# Patient Record
Sex: Female | Born: 1980 | Race: White | Hispanic: No | Marital: Single | Smoking: Never smoker
Health system: Southern US, Community
[De-identification: ages and names within clinical notes are randomized; demographics above are authoritative.]

## PROBLEM LIST (undated history)

## (undated) DIAGNOSIS — E538 Deficiency of other specified B group vitamins: Secondary | ICD-10-CM

## (undated) DIAGNOSIS — K449 Diaphragmatic hernia without obstruction or gangrene: Secondary | ICD-10-CM

## (undated) DIAGNOSIS — K589 Irritable bowel syndrome without diarrhea: Secondary | ICD-10-CM

## (undated) DIAGNOSIS — R519 Headache, unspecified: Secondary | ICD-10-CM

## (undated) DIAGNOSIS — K219 Gastro-esophageal reflux disease without esophagitis: Secondary | ICD-10-CM

## (undated) DIAGNOSIS — D649 Anemia, unspecified: Secondary | ICD-10-CM

## (undated) DIAGNOSIS — G8929 Other chronic pain: Secondary | ICD-10-CM

## (undated) DIAGNOSIS — R197 Diarrhea, unspecified: Secondary | ICD-10-CM

## (undated) DIAGNOSIS — E039 Hypothyroidism, unspecified: Secondary | ICD-10-CM

## (undated) DIAGNOSIS — E079 Disorder of thyroid, unspecified: Secondary | ICD-10-CM

## (undated) DIAGNOSIS — K2970 Gastritis, unspecified, without bleeding: Secondary | ICD-10-CM

## (undated) DIAGNOSIS — K2990 Gastroduodenitis, unspecified, without bleeding: Secondary | ICD-10-CM

## (undated) DIAGNOSIS — M25569 Pain in unspecified knee: Secondary | ICD-10-CM

## (undated) DIAGNOSIS — R51 Headache: Secondary | ICD-10-CM

## (undated) DIAGNOSIS — F32A Depression, unspecified: Secondary | ICD-10-CM

## (undated) DIAGNOSIS — F419 Anxiety disorder, unspecified: Secondary | ICD-10-CM

## (undated) DIAGNOSIS — A498 Other bacterial infections of unspecified site: Secondary | ICD-10-CM

## (undated) DIAGNOSIS — F329 Major depressive disorder, single episode, unspecified: Secondary | ICD-10-CM

## (undated) HISTORY — DX: Gastro-esophageal reflux disease without esophagitis: K21.9

## (undated) HISTORY — DX: Irritable bowel syndrome, unspecified: K58.9

## (undated) HISTORY — DX: Other chronic pain: G89.29

## (undated) HISTORY — PX: APPENDECTOMY: SHX54

## (undated) HISTORY — DX: Disorder of thyroid, unspecified: E07.9

## (undated) HISTORY — DX: Gastroduodenitis, unspecified, without bleeding: K29.90

## (undated) HISTORY — DX: Gastritis, unspecified, without bleeding: K29.70

## (undated) HISTORY — DX: Headache, unspecified: R51.9

## (undated) HISTORY — DX: Anemia, unspecified: D64.9

## (undated) HISTORY — DX: Depression, unspecified: F32.A

## (undated) HISTORY — PX: KNEE SURGERY: SHX244

## (undated) HISTORY — DX: Major depressive disorder, single episode, unspecified: F32.9

## (undated) HISTORY — PX: ESOPHAGOGASTRODUODENOSCOPY: SHX1529

## (undated) HISTORY — DX: Diaphragmatic hernia without obstruction or gangrene: K44.9

## (undated) HISTORY — PX: COLONOSCOPY: SHX174

## (undated) HISTORY — DX: Headache: R51

## (undated) HISTORY — DX: Pain in unspecified knee: M25.569

## (undated) HISTORY — DX: Deficiency of other specified B group vitamins: E53.8

## (undated) HISTORY — DX: Diarrhea, unspecified: R19.7

## (undated) HISTORY — DX: Other bacterial infections of unspecified site: A49.8

## (undated) HISTORY — DX: Anxiety disorder, unspecified: F41.9

---
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
## 1998-11-16 ENCOUNTER — Other Ambulatory Visit: Admission: RE | Admit: 1998-11-16 | Discharge: 1998-11-16 | Payer: Self-pay | Admitting: Family Medicine

## 2002-03-14 ENCOUNTER — Ambulatory Visit (HOSPITAL_COMMUNITY): Admission: RE | Admit: 2002-03-14 | Discharge: 2002-03-14 | Payer: Self-pay | Admitting: Family Medicine

## 2002-03-14 ENCOUNTER — Encounter: Payer: Self-pay | Admitting: Family Medicine

## 2003-04-02 ENCOUNTER — Other Ambulatory Visit: Admission: RE | Admit: 2003-04-02 | Discharge: 2003-04-02 | Payer: Self-pay | Admitting: Family Medicine

## 2004-03-25 ENCOUNTER — Other Ambulatory Visit: Admission: RE | Admit: 2004-03-25 | Discharge: 2004-03-25 | Payer: Self-pay | Admitting: Obstetrics and Gynecology

## 2005-03-30 ENCOUNTER — Other Ambulatory Visit: Admission: RE | Admit: 2005-03-30 | Discharge: 2005-03-30 | Payer: Self-pay | Admitting: Obstetrics and Gynecology

## 2006-08-21 ENCOUNTER — Ambulatory Visit: Payer: Self-pay | Admitting: Internal Medicine

## 2006-08-30 ENCOUNTER — Ambulatory Visit: Payer: Self-pay | Admitting: Internal Medicine

## 2006-08-30 LAB — CONVERTED CEMR LAB
ALT: 17 units/L (ref 0–35)
AST: 25 units/L (ref 0–37)
Bilirubin, Direct: 0.1 mg/dL (ref 0.0–0.3)
CO2: 31 meq/L (ref 19–32)
Calcium: 9.7 mg/dL (ref 8.4–10.5)
Chloride: 102 meq/L (ref 96–112)
Cholesterol: 184 mg/dL (ref 0–200)
GFR calc non Af Amer: 91 mL/min
Glucose, Bld: 97 mg/dL (ref 70–99)
TSH: 3.74 microintl units/mL (ref 0.35–5.50)
Total Protein: 7.6 g/dL (ref 6.0–8.3)

## 2006-08-31 ENCOUNTER — Ambulatory Visit: Payer: Self-pay | Admitting: Internal Medicine

## 2006-09-18 ENCOUNTER — Ambulatory Visit: Payer: Self-pay | Admitting: Internal Medicine

## 2006-09-18 LAB — CONVERTED CEMR LAB
Ferritin: 17.2 ng/mL (ref 10.0–291.0)
Saturation Ratios: 17.1 % — ABNORMAL LOW (ref 20.0–50.0)

## 2006-09-21 ENCOUNTER — Encounter: Admission: RE | Admit: 2006-09-21 | Discharge: 2006-09-21 | Payer: Self-pay | Admitting: Internal Medicine

## 2006-10-19 ENCOUNTER — Ambulatory Visit: Payer: Self-pay | Admitting: Internal Medicine

## 2006-10-25 ENCOUNTER — Ambulatory Visit: Payer: Self-pay | Admitting: Internal Medicine

## 2006-10-25 ENCOUNTER — Encounter: Admission: RE | Admit: 2006-10-25 | Discharge: 2006-10-25 | Payer: Self-pay | Admitting: Internal Medicine

## 2006-10-28 ENCOUNTER — Encounter (INDEPENDENT_AMBULATORY_CARE_PROVIDER_SITE_OTHER): Payer: Self-pay | Admitting: General Surgery

## 2006-10-28 ENCOUNTER — Inpatient Hospital Stay (HOSPITAL_COMMUNITY): Admission: EM | Admit: 2006-10-28 | Discharge: 2006-10-30 | Payer: Self-pay | Admitting: Emergency Medicine

## 2006-10-31 ENCOUNTER — Ambulatory Visit: Payer: Self-pay | Admitting: Internal Medicine

## 2007-03-14 ENCOUNTER — Ambulatory Visit: Payer: Self-pay | Admitting: Internal Medicine

## 2007-03-14 DIAGNOSIS — R197 Diarrhea, unspecified: Secondary | ICD-10-CM | POA: Insufficient documentation

## 2007-03-19 ENCOUNTER — Encounter (INDEPENDENT_AMBULATORY_CARE_PROVIDER_SITE_OTHER): Payer: Self-pay | Admitting: *Deleted

## 2007-03-26 LAB — CONVERTED CEMR LAB: Pap Smear: NORMAL

## 2007-04-03 ENCOUNTER — Ambulatory Visit: Payer: Self-pay | Admitting: Gastroenterology

## 2007-04-03 LAB — CONVERTED CEMR LAB
Ferritin: 26.7 ng/mL (ref 10.0–291.0)
Saturation Ratios: 15.2 % — ABNORMAL LOW (ref 20.0–50.0)
Tissue Transglutaminase Ab, IgA: 0.6 units (ref <7)
Vitamin B-12: 136 pg/mL — ABNORMAL LOW (ref 211–911)

## 2007-05-02 ENCOUNTER — Encounter: Payer: Self-pay | Admitting: Internal Medicine

## 2007-05-02 ENCOUNTER — Encounter: Payer: Self-pay | Admitting: Gastroenterology

## 2007-05-02 ENCOUNTER — Ambulatory Visit: Payer: Self-pay | Admitting: Gastroenterology

## 2007-05-02 DIAGNOSIS — K2990 Gastroduodenitis, unspecified, without bleeding: Secondary | ICD-10-CM

## 2007-05-02 DIAGNOSIS — K2970 Gastritis, unspecified, without bleeding: Secondary | ICD-10-CM | POA: Insufficient documentation

## 2007-05-02 DIAGNOSIS — K449 Diaphragmatic hernia without obstruction or gangrene: Secondary | ICD-10-CM | POA: Insufficient documentation

## 2007-05-02 DIAGNOSIS — K589 Irritable bowel syndrome without diarrhea: Secondary | ICD-10-CM | POA: Insufficient documentation

## 2007-05-09 ENCOUNTER — Ambulatory Visit: Payer: Self-pay | Admitting: Gastroenterology

## 2007-05-16 ENCOUNTER — Ambulatory Visit: Payer: Self-pay | Admitting: Gastroenterology

## 2007-05-25 DIAGNOSIS — R519 Headache, unspecified: Secondary | ICD-10-CM | POA: Insufficient documentation

## 2007-05-25 DIAGNOSIS — R51 Headache: Secondary | ICD-10-CM | POA: Insufficient documentation

## 2007-05-29 ENCOUNTER — Ambulatory Visit: Payer: Self-pay | Admitting: Gastroenterology

## 2007-06-13 ENCOUNTER — Ambulatory Visit: Payer: Self-pay | Admitting: Gastroenterology

## 2007-06-25 ENCOUNTER — Telehealth: Payer: Self-pay | Admitting: Internal Medicine

## 2007-06-26 ENCOUNTER — Telehealth: Payer: Self-pay | Admitting: Gastroenterology

## 2007-06-26 ENCOUNTER — Encounter: Payer: Self-pay | Admitting: Gastroenterology

## 2007-07-13 ENCOUNTER — Ambulatory Visit: Payer: Self-pay | Admitting: Internal Medicine

## 2007-07-13 DIAGNOSIS — M25579 Pain in unspecified ankle and joints of unspecified foot: Secondary | ICD-10-CM | POA: Insufficient documentation

## 2007-07-13 DIAGNOSIS — R209 Unspecified disturbances of skin sensation: Secondary | ICD-10-CM | POA: Insufficient documentation

## 2007-07-16 ENCOUNTER — Telehealth: Payer: Self-pay | Admitting: Internal Medicine

## 2007-07-18 ENCOUNTER — Ambulatory Visit: Payer: Self-pay | Admitting: Gastroenterology

## 2007-07-25 ENCOUNTER — Encounter (INDEPENDENT_AMBULATORY_CARE_PROVIDER_SITE_OTHER): Payer: Self-pay | Admitting: *Deleted

## 2007-08-15 ENCOUNTER — Ambulatory Visit: Payer: Self-pay | Admitting: Gastroenterology

## 2007-08-15 DIAGNOSIS — E538 Deficiency of other specified B group vitamins: Secondary | ICD-10-CM | POA: Insufficient documentation

## 2007-09-12 ENCOUNTER — Telehealth (INDEPENDENT_AMBULATORY_CARE_PROVIDER_SITE_OTHER): Payer: Self-pay | Admitting: *Deleted

## 2007-09-12 ENCOUNTER — Ambulatory Visit: Payer: Self-pay | Admitting: Internal Medicine

## 2007-09-12 ENCOUNTER — Ambulatory Visit: Payer: Self-pay | Admitting: Gastroenterology

## 2007-09-12 DIAGNOSIS — K5289 Other specified noninfective gastroenteritis and colitis: Secondary | ICD-10-CM | POA: Insufficient documentation

## 2007-09-24 ENCOUNTER — Encounter: Payer: Self-pay | Admitting: Internal Medicine

## 2007-09-28 ENCOUNTER — Telehealth: Payer: Self-pay | Admitting: Internal Medicine

## 2007-09-28 ENCOUNTER — Encounter: Payer: Self-pay | Admitting: Internal Medicine

## 2007-09-29 ENCOUNTER — Encounter: Admission: RE | Admit: 2007-09-29 | Discharge: 2007-09-29 | Payer: Self-pay | Admitting: Internal Medicine

## 2007-10-01 ENCOUNTER — Encounter (HOSPITAL_COMMUNITY): Admission: RE | Admit: 2007-10-01 | Discharge: 2007-10-18 | Payer: Self-pay | Admitting: Neurology

## 2007-10-01 ENCOUNTER — Telehealth: Payer: Self-pay | Admitting: Internal Medicine

## 2007-10-04 ENCOUNTER — Ambulatory Visit: Payer: Self-pay | Admitting: Internal Medicine

## 2007-10-04 DIAGNOSIS — R930 Abnormal findings on diagnostic imaging of skull and head, not elsewhere classified: Secondary | ICD-10-CM | POA: Insufficient documentation

## 2007-10-04 DIAGNOSIS — R9409 Abnormal results of other function studies of central nervous system: Secondary | ICD-10-CM | POA: Insufficient documentation

## 2007-10-04 DIAGNOSIS — J320 Chronic maxillary sinusitis: Secondary | ICD-10-CM | POA: Insufficient documentation

## 2007-10-04 DIAGNOSIS — D509 Iron deficiency anemia, unspecified: Secondary | ICD-10-CM | POA: Insufficient documentation

## 2007-10-08 ENCOUNTER — Telehealth: Payer: Self-pay | Admitting: Internal Medicine

## 2007-10-10 ENCOUNTER — Ambulatory Visit: Payer: Self-pay | Admitting: Internal Medicine

## 2007-10-10 ENCOUNTER — Ambulatory Visit: Payer: Self-pay | Admitting: Gastroenterology

## 2007-10-10 LAB — CONVERTED CEMR LAB: Free T4: 0.7 ng/dL (ref 0.6–1.6)

## 2007-10-11 ENCOUNTER — Telehealth: Payer: Self-pay | Admitting: Internal Medicine

## 2007-10-12 ENCOUNTER — Ambulatory Visit: Payer: Self-pay | Admitting: Gastroenterology

## 2007-11-06 ENCOUNTER — Telehealth: Payer: Self-pay | Admitting: Internal Medicine

## 2007-11-06 DIAGNOSIS — E079 Disorder of thyroid, unspecified: Secondary | ICD-10-CM | POA: Insufficient documentation

## 2007-11-07 ENCOUNTER — Ambulatory Visit: Payer: Self-pay | Admitting: Internal Medicine

## 2007-11-22 ENCOUNTER — Encounter: Payer: Self-pay | Admitting: Gastroenterology

## 2007-12-12 ENCOUNTER — Ambulatory Visit: Payer: Self-pay | Admitting: Internal Medicine

## 2008-01-11 ENCOUNTER — Telehealth: Payer: Self-pay | Admitting: Gastroenterology

## 2008-01-16 ENCOUNTER — Ambulatory Visit: Payer: Self-pay | Admitting: Gastroenterology

## 2008-02-13 ENCOUNTER — Ambulatory Visit: Payer: Self-pay | Admitting: Gastroenterology

## 2008-03-12 ENCOUNTER — Ambulatory Visit: Payer: Self-pay | Admitting: Gastroenterology

## 2008-05-14 ENCOUNTER — Ambulatory Visit: Payer: Self-pay | Admitting: Gastroenterology

## 2008-05-28 ENCOUNTER — Telehealth: Payer: Self-pay | Admitting: Gastroenterology

## 2008-05-29 ENCOUNTER — Encounter: Payer: Self-pay | Admitting: Gastroenterology

## 2008-06-11 ENCOUNTER — Ambulatory Visit: Payer: Self-pay | Admitting: Gastroenterology

## 2008-07-09 ENCOUNTER — Ambulatory Visit: Payer: Self-pay | Admitting: Gastroenterology

## 2008-08-13 ENCOUNTER — Ambulatory Visit: Payer: Self-pay | Admitting: Gastroenterology

## 2008-08-20 ENCOUNTER — Ambulatory Visit (HOSPITAL_COMMUNITY): Admission: RE | Admit: 2008-08-20 | Discharge: 2008-08-20 | Payer: Self-pay | Admitting: Internal Medicine

## 2008-08-20 ENCOUNTER — Ambulatory Visit: Payer: Self-pay | Admitting: Internal Medicine

## 2008-08-21 ENCOUNTER — Telehealth: Payer: Self-pay | Admitting: Internal Medicine

## 2008-08-25 ENCOUNTER — Telehealth: Payer: Self-pay | Admitting: Internal Medicine

## 2008-09-12 ENCOUNTER — Ambulatory Visit: Payer: Self-pay | Admitting: Gastroenterology

## 2008-10-15 ENCOUNTER — Ambulatory Visit: Payer: Self-pay | Admitting: Gastroenterology

## 2008-11-19 ENCOUNTER — Ambulatory Visit: Payer: Self-pay | Admitting: Gastroenterology

## 2008-12-03 ENCOUNTER — Encounter: Payer: Self-pay | Admitting: Internal Medicine

## 2008-12-24 ENCOUNTER — Ambulatory Visit: Payer: Self-pay | Admitting: Gastroenterology

## 2009-01-20 ENCOUNTER — Ambulatory Visit: Payer: Self-pay | Admitting: Gastroenterology

## 2009-01-21 DIAGNOSIS — R1084 Generalized abdominal pain: Secondary | ICD-10-CM | POA: Insufficient documentation

## 2009-01-21 LAB — CONVERTED CEMR LAB
Albumin: 3.7 g/dL (ref 3.5–5.2)
Basophils Absolute: 0.1 10*3/uL (ref 0.0–0.1)
CO2: 30 meq/L (ref 19–32)
Chloride: 102 meq/L (ref 96–112)
Eosinophils Absolute: 0.2 10*3/uL (ref 0.0–0.7)
Ferritin: 18.8 ng/mL (ref 10.0–291.0)
HCT: 37.9 % (ref 36.0–46.0)
Hemoglobin: 12.9 g/dL (ref 12.0–15.0)
Iron: 50 ug/dL (ref 42–145)
Lymphocytes Relative: 27.2 % (ref 12.0–46.0)
Lymphs Abs: 2.4 10*3/uL (ref 0.7–4.0)
MCHC: 33.9 g/dL (ref 30.0–36.0)
MCV: 90.1 fL (ref 78.0–100.0)
Monocytes Absolute: 0.7 10*3/uL (ref 0.1–1.0)
Neutro Abs: 5.4 10*3/uL (ref 1.4–7.7)
Potassium: 3.4 meq/L — ABNORMAL LOW (ref 3.5–5.1)
RDW: 13.2 % (ref 11.5–14.6)
Sed Rate: 47 mm/hr — ABNORMAL HIGH (ref 0–22)
Sodium: 139 meq/L (ref 135–145)
TSH: 2.67 microintl units/mL (ref 0.35–5.50)
Total Protein: 7.3 g/dL (ref 6.0–8.3)
Vitamin B-12: 531 pg/mL (ref 211–911)

## 2009-01-23 ENCOUNTER — Ambulatory Visit (HOSPITAL_COMMUNITY): Admission: RE | Admit: 2009-01-23 | Discharge: 2009-01-23 | Payer: Self-pay | Admitting: Gastroenterology

## 2009-01-26 ENCOUNTER — Telehealth: Payer: Self-pay | Admitting: Gastroenterology

## 2009-02-18 ENCOUNTER — Telehealth (INDEPENDENT_AMBULATORY_CARE_PROVIDER_SITE_OTHER): Payer: Self-pay | Admitting: *Deleted

## 2009-02-18 ENCOUNTER — Ambulatory Visit: Payer: Self-pay | Admitting: Gastroenterology

## 2009-03-23 ENCOUNTER — Ambulatory Visit: Payer: Self-pay | Admitting: Gastroenterology

## 2009-04-22 ENCOUNTER — Telehealth: Payer: Self-pay | Admitting: Gastroenterology

## 2009-04-22 ENCOUNTER — Ambulatory Visit: Payer: Self-pay | Admitting: Gastroenterology

## 2009-04-24 ENCOUNTER — Encounter: Payer: Self-pay | Admitting: Gastroenterology

## 2009-05-22 ENCOUNTER — Ambulatory Visit: Payer: Self-pay | Admitting: Gastroenterology

## 2009-06-17 ENCOUNTER — Ambulatory Visit: Payer: Self-pay | Admitting: Gastroenterology

## 2009-07-17 ENCOUNTER — Ambulatory Visit: Payer: Self-pay | Admitting: Gastroenterology

## 2009-08-19 ENCOUNTER — Ambulatory Visit: Payer: Self-pay | Admitting: Gastroenterology

## 2009-09-04 ENCOUNTER — Telehealth: Payer: Self-pay | Admitting: Gastroenterology

## 2009-09-23 ENCOUNTER — Ambulatory Visit: Payer: Self-pay | Admitting: Gastroenterology

## 2009-10-29 ENCOUNTER — Ambulatory Visit: Payer: Self-pay | Admitting: Gastroenterology

## 2009-11-26 ENCOUNTER — Ambulatory Visit: Payer: Self-pay | Admitting: Gastroenterology

## 2009-12-24 ENCOUNTER — Ambulatory Visit: Payer: Self-pay | Admitting: Gastroenterology

## 2010-01-25 ENCOUNTER — Ambulatory Visit: Payer: Self-pay | Admitting: Gastroenterology

## 2010-02-24 ENCOUNTER — Ambulatory Visit
Admission: RE | Admit: 2010-02-24 | Discharge: 2010-02-24 | Payer: Self-pay | Source: Home / Self Care | Attending: Gastroenterology | Admitting: Gastroenterology

## 2010-03-09 NOTE — Assessment & Plan Note (Signed)
Summary: MONTHLY B12 SHOT...LSW.  Nurse Visit   Allergies: 1)  ! Prednisone 2)  ! Percocet  Medication Administration  Injection # 1:    Medication: Vit B12 1000 mcg    Diagnosis: B12 DEFICIENCY (ICD-266.2)    Route: IM    Site: L deltoid    Exp Date: 03/11/2011    Lot #: [id number]    Mfr: [employer]    Patient tolerated injection without complications    Given by: Harlow Mares CMA Duncan Dull) (August 19, 2009 3:46 PM)

## 2010-03-09 NOTE — Assessment & Plan Note (Signed)
Summary: MONTHLY B12 SHOT...LSW.  Nurse Visit   Allergies: 1)  ! Prednisone 2)  ! Percocet  Medication Administration  Injection # 1:    Medication: Vit B12 1000 mcg    Diagnosis: B12 DEFICIENCY (ICD-266.2)    Route: IM    Site: R deltoid    Exp Date: 07/09/2011    Lot #: [id number]    Mfr: [employer]    Comments: Monthly Injection    Patient tolerated injection without complications    Given by: June McMurray CMA Duncan Dull) (September 23, 2009 4:02 PM)  Orders Added: 1)  Vit B12 1000 mcg [J3420]

## 2010-03-09 NOTE — Assessment & Plan Note (Signed)
Summary: MONTHLY B12 SHOT...LSW.  Nurse Visit   Allergies: 1)  ! Prednisone 2)  ! Percocet  Medication Administration  Injection # 1:    Medication: Vit B12 1000 mcg    Diagnosis: B12 DEFICIENCY (ICD-266.2)    Route: IM    Site: R deltoid    Exp Date: 03/11/2011    Lot #: [id number]    Mfr: [employer]    Patient tolerated injection without complications    Given by: Christie Nottingham CMA Duncan Dull) (July 17, 2009 3:35 PM)  Orders Added: 1)  Vit B12 1000 mcg [J3420]

## 2010-03-09 NOTE — Progress Notes (Signed)
Summary: B12   Phone Note Call from Patient   Summary of Call: Pt was in today and recieved her monthly b12 injection and would like to go twice monthly with her injections.  She feels monthly is not helping.   Initial call taken by: Chales Abrahams CMA Duncan Dull),  February 18, 2009 4:02 PM  Follow-up for Phone Call        Talked with pt.  She feels that B12 injectons are not working because she sees no difference in her energy level.   Remains tired all the time.  Wanted to know if getting B12 inj more often would help.  Informed pt that last B12 level checked in Dec was 500 which is a normal level.  Any other suggestions? Follow-up by: Ashok Cordia RN,  February 19, 2009 8:41 AM  Additional Follow-up for Phone Call Additional follow up Details #1::         not needed...this is not the sourceof her fatigue... Additional Follow-up by: Mardella Layman MD FACG,  February 20, 2009 8:28 AM    Additional Follow-up for Phone Call Additional follow up Details #2::    Pt notified.   Follow-up by: Ashok Cordia RN,  February 20, 2009 9:02 AM

## 2010-03-09 NOTE — Assessment & Plan Note (Signed)
Summary: MONTHLY B12/JMS  Nurse Visit   Allergies: 1)  ! Prednisone 2)  ! Percocet  Medication Administration  Injection # 1:    Medication: Vit B12 1000 mcg    Diagnosis: B12 DEFICIENCY (ICD-266.2)    Route: IM    Site: R deltoid    Exp Date: 08/08/2011    Lot #: [id number]    Mfr: [employer]    Comments: Monthly injection    Patient tolerated injection without complications    Given by: June McMurray CMA Duncan Dull) (November 26, 2009 3:41 PM)  Orders Added: 1)  Vit B12 1000 mcg [J3420]   Medication Administration  Injection # 1:    Medication: Vit B12 1000 mcg    Diagnosis: B12 DEFICIENCY (ICD-266.2)    Route: IM    Site: R deltoid    Exp Date: 08/08/2011    Lot #: [id number]    Mfr: [employer]    Comments: Monthly injection    Patient tolerated injection without complications    Given by: June McMurray CMA Duncan Dull) (November 26, 2009 3:41 PM)  Orders Added: 1)  Vit B12 1000 mcg [J3420]

## 2010-03-09 NOTE — Progress Notes (Signed)
Summary: Triage   Phone Note Call from Patient Call back at [phone]   Caller: mother Bonita Quin Call For: Dr. Jarold Motto Reason for Call: Talk to Nurse Summary of Call: Wants to know if Dr. Jarold Motto would write a note stating he recommends she join [employer] due to being obese Initial call taken by: Karna Christmas,  September 04, 2009 2:11 PM  Follow-up for Phone Call        Letter mailed to pt.  Pt notified. Follow-up by: Ashok Cordia RN,  September 04, 2009 2:29 PM

## 2010-03-09 NOTE — Medication Information (Signed)
Summary: Prior [employer] & Approved for Dexilant/[employer]  Prior Duard Brady & Approved for Dexilant/[employer]   Imported By: Sherian Rein 05/01/2009 14:13:26  _____________________________________________________________________  External Attachment:    Type:   Image     Comment:   External Document

## 2010-03-09 NOTE — Assessment & Plan Note (Signed)
Summary: MONTHLY B12 SHOT...LSW.  Nurse Visit   Allergies: 1)  ! Prednisone 2)  ! Percocet  Medication Administration  Injection # 1:    Medication: Vit B12 1000 mcg    Diagnosis: B12 DEFICIENCY (ICD-266.2)    Route: IM    Site: R deltoid    Exp Date: 03/11/2011    Lot #: [id number]    Mfr: [employer]    Patient tolerated injection without complications    Given by: Christie Nottingham CMA Duncan Dull) (May 22, 2009 3:32 PM)  Orders Added: 1)  Vit B12 1000 mcg [J3420]

## 2010-03-09 NOTE — Assessment & Plan Note (Signed)
Summary: MONTHLY B12 SHOT...LSW.  Nurse Visit   Allergies: 1)  ! Prednisone 2)  ! Percocet  Medication Administration  Injection # 1:    Medication: Vit B12 1000 mcg    Diagnosis: B12 DEFICIENCY (ICD-266.2)    Route: IM    Site: R deltoid    Exp Date: 09/2011    Lot #: [id number]    Mfr: [employer]    Patient tolerated injection without complications    Given by: Merri Ray CMA Duncan Dull) (December 24, 2009 3:51 PM)  Orders Added: 1)  Vit B12 1000 mcg [J3420]

## 2010-03-09 NOTE — Assessment & Plan Note (Signed)
Summary: MONTHLY B12 SHOT...LSW.  Nurse Visit   Allergies: 1)  ! Prednisone 2)  ! Percocet  Medication Administration  Injection # 1:    Medication: Vit B12 1000 mcg    Diagnosis: B12 DEFICIENCY (ICD-266.2)    Route: IM    Site: R deltoid    Exp Date: 11/12    Lot #: [id number]    Mfr: [employer]    Comments: pt to schedule next monthly injection at front desk    Patient tolerated injection without complications    Given by: Chales Abrahams CMA Duncan Dull) (March 23, 2009 4:06 PM)  Orders Added: 1)  Vit B12 1000 mcg [J3420]

## 2010-03-09 NOTE — Progress Notes (Signed)
Summary: Delilant  Medications Added DEXILANT 60 MG CPDR (DEXLANSOPRAZOLE) one tablet by mouth once daily       Phone Note Call from Patient   Caller: Patient Summary of Call: Pt states that nexium is not helping symptoms and she feels this is caousing abd cramping.  Dexilant was much more effective for her symptoms.  Would like to go back to the dexilant.  Samples given to pt to last until we see if insurance will approve it. Initial call taken by: Ashok Cordia RN,  April 22, 2009 4:21 PM  Follow-up for Phone Call        Rx for dexilant sent to [facility] on [address].  If not covered under insurance plan, we can try to get prior auth.  Pt notified. Follow-up by: Ashok Cordia RN,  April 23, 2009 9:51 AM    New/Updated Medications: DEXILANT 60 MG CPDR (DEXLANSOPRAZOLE) one tablet by mouth once daily Prescriptions: DEXILANT 60 MG CPDR (DEXLANSOPRAZOLE) one tablet by mouth once daily  #30 x 6   Entered by:   Ashok Cordia RN   Authorized by:   Mardella Layman MD [facility]   Signed by:   Ashok Cordia RN on 04/23/2009   Method used:   Electronically to        [facility]  [phone]* (retail)       [address]       [place], [place]  [address]       Ph: [phone] or [phone]       Fax: [phone]   RxID:   [id number]   Appended Document: Delilant Dexilant approved.

## 2010-03-09 NOTE — Assessment & Plan Note (Signed)
Summary: monthly b-12 inj/all  Nurse Visit   Allergies: 1)  ! Prednisone 2)  ! Percocet  Medication Administration  Injection # 1:    Medication: Vit B12 1000 mcg    Diagnosis: B12 DEFICIENCY (ICD-266.2)    Route: IM    Site: L deltoid    Exp Date: 12/12    Lot #: [id number]    Mfr: [employer]    Patient tolerated injection without complications    Given by: Milford Cage NCMA (Jun 17, 2009 1:15 PM)  Orders Added: 1)  Vit B12 1000 mcg [J3420]

## 2010-03-09 NOTE — Assessment & Plan Note (Signed)
Summary: Monthly B12/dfs  Nurse Visit   Allergies: 1)  ! Prednisone 2)  ! Percocet  Medication Administration  Injection # 1:    Medication: Vit B12 1000 mcg    Diagnosis: ANEMIA, IRON DEFICIENCY (ICD-280.9)    Route: IM    Site: L deltoid    Exp Date: 12/2010    Lot #: [id number]    Mfr: [employer]    Comments: pt to schedule next monthy at front desk    Patient tolerated injection without complications    Given by: Chales Abrahams CMA Duncan Dull) (February 18, 2009 4:01 PM)  Orders Added: 1)  Vit B12 1000 mcg [J3420]

## 2010-03-09 NOTE — Assessment & Plan Note (Signed)
Summary: B12 SHOT  Nurse Visit   Allergies: 1)  ! Prednisone 2)  ! Percocet  Medication Administration  Injection # 1:    Medication: Vit B12 1000 mcg    Diagnosis: B12 DEFICIENCY (ICD-266.2)    Route: IM    Site: L deltoid    Exp Date: 12/2010    Lot #: [id number]    Mfr: [employer]    Comments: pt to schedule next monthly b12 at front desk    Patient tolerated injection without complications    Given by: Chales Abrahams CMA Duncan Dull) (April 22, 2009 4:06 PM)  Orders Added: 1)  Vit B12 1000 mcg [J3420]

## 2010-03-11 NOTE — Assessment & Plan Note (Signed)
Summary: MONTHLY B12 SHOT  Nurse Visit   Allergies: 1)  ! Prednisone 2)  ! Percocet  Medication Administration  Injection # 1:    Medication: Vit B12 1000 mcg    Diagnosis: B12 DEFICIENCY (ICD-266.2)    Route: IM    Site: L deltoid    Exp Date: 11/08/2011    Lot #: [id number]    Mfr: [employer]    Comments: Monthly vitamin B12 injection    Patient tolerated injection without complications    Given by: June McMurray CMA Duncan Dull) (January 25, 2010 4:06 PM)  Orders Added: 1)  Vit B12 1000 mcg [J3420]   Medication Administration  Injection # 1:    Medication: Vit B12 1000 mcg    Diagnosis: B12 DEFICIENCY (ICD-266.2)    Route: IM    Site: L deltoid    Exp Date: 11/08/2011    Lot #: [id number]    Mfr: [employer]    Comments: Monthly vitamin B12 injection    Patient tolerated injection without complications    Given by: June McMurray CMA Duncan Dull) (January 25, 2010 4:06 PM)  Orders Added: 1)  Vit B12 1000 mcg [J3420]

## 2010-03-11 NOTE — Assessment & Plan Note (Signed)
Summary: MONTHLY B12 SHOT/JMS  Nurse Visit   Allergies: 1)  ! Prednisone 2)  ! Percocet  Medication Administration  Injection # 1:    Medication: Vit B12 1000 mcg    Diagnosis: B12 DEFICIENCY (ICD-266.2)    Route: IM    Site: R deltoid    Exp Date: 12/09/2011    Lot #: [id number]    Mfr: [employer]    Comments: monthlly injection    Patient tolerated injection without complications    Given by: June McMurray CMA Duncan Dull) (February 24, 2010 4:12 PM)  Orders Added: 1)  Vit B12 1000 mcg [J3420]   Medication Administration  Injection # 1:    Medication: Vit B12 1000 mcg    Diagnosis: B12 DEFICIENCY (ICD-266.2)    Route: IM    Site: R deltoid    Exp Date: 12/09/2011    Lot #: [id number]    Mfr: [employer]    Comments: monthlly injection    Patient tolerated injection without complications    Given by: June McMurray CMA Duncan Dull) (February 24, 2010 4:12 PM)  Orders Added: 1)  Vit B12 1000 mcg [J3420]

## 2010-03-31 ENCOUNTER — Encounter: Payer: Self-pay | Admitting: Gastroenterology

## 2010-03-31 ENCOUNTER — Encounter (INDEPENDENT_AMBULATORY_CARE_PROVIDER_SITE_OTHER): Payer: BC Managed Care – PPO

## 2010-03-31 DIAGNOSIS — E538 Deficiency of other specified B group vitamins: Secondary | ICD-10-CM

## 2010-04-06 NOTE — Assessment & Plan Note (Signed)
Summary: MONTLY B12 SHOT  Nurse Visit   Allergies: 1)  ! Prednisone 2)  ! Percocet  Medication Administration  Injection # 1:    Medication: Vit B12 1000 mcg    Diagnosis: B12 DEFICIENCY (ICD-266.2)    Route: IM    Site: R deltoid    Exp Date: 11/13    Lot #: [id number]    Mfr: [employer]    Comments: pt to schedule next monthly b12 at front desk    Patient tolerated injection without complications    Given by: Chales Abrahams CMA Duncan Dull) (March 31, 2010 4:13 PM)  Orders Added: 1)  Vit B12 1000 mcg [J3420]

## 2010-04-29 ENCOUNTER — Ambulatory Visit (INDEPENDENT_AMBULATORY_CARE_PROVIDER_SITE_OTHER): Payer: BC Managed Care – PPO | Admitting: Gastroenterology

## 2010-04-29 DIAGNOSIS — E538 Deficiency of other specified B group vitamins: Secondary | ICD-10-CM

## 2010-04-29 MED ORDER — CYANOCOBALAMIN 1000 MCG/ML IJ SOLN
1000.0000 ug | INTRAMUSCULAR | Status: AC
  Administered 2010-04-29: 1000 ug via INTRAMUSCULAR

## 2010-05-03 ENCOUNTER — Other Ambulatory Visit: Payer: Self-pay | Admitting: Gastroenterology

## 2010-05-06 ENCOUNTER — Telehealth: Payer: Self-pay | Admitting: Gastroenterology

## 2010-05-06 MED ORDER — DEXLANSOPRAZOLE 60 MG PO CPDR: 60.0000 mg | DELAYED_RELEASE_CAPSULE | Freq: Every day | ORAL | Status: DC

## 2010-05-06 NOTE — Telephone Encounter (Addendum)
i advised pt I will do her prior auth today and call her when its done and I advised her she needs office visit she has not been seen since 2010. appt made for 05/27/2010.

## 2010-05-06 NOTE — Telephone Encounter (Signed)
Per [employer] has been approved. Faxed approval to the pharm.

## 2010-05-21 ENCOUNTER — Ambulatory Visit (HOSPITAL_BASED_OUTPATIENT_CLINIC_OR_DEPARTMENT_OTHER)
Admission: RE | Admit: 2010-05-21 | Discharge: 2010-05-21 | Disposition: A | Discharge status: Home / Self Care | Payer: BC Managed Care – PPO | Source: Ambulatory Visit | Attending: Specialist | Admitting: Specialist

## 2010-05-21 DIAGNOSIS — M2240 Chondromalacia patellae, unspecified knee: Secondary | ICD-10-CM | POA: Insufficient documentation

## 2010-05-21 DIAGNOSIS — M659 Unspecified synovitis and tenosynovitis, unspecified site: Secondary | ICD-10-CM | POA: Insufficient documentation

## 2010-05-21 DIAGNOSIS — M2350 Chronic instability of knee, unspecified knee: Secondary | ICD-10-CM | POA: Insufficient documentation

## 2010-05-21 DIAGNOSIS — Z01812 Encounter for preprocedural laboratory examination: Secondary | ICD-10-CM | POA: Insufficient documentation

## 2010-05-21 LAB — POCT HEMOGLOBIN-HEMACUE: Hemoglobin: 12.7 g/dL (ref 12.0–15.0)
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
## 2010-05-27 ENCOUNTER — Ambulatory Visit: Payer: BC Managed Care – PPO | Admitting: Gastroenterology

## 2010-05-28 ENCOUNTER — Other Ambulatory Visit (INDEPENDENT_AMBULATORY_CARE_PROVIDER_SITE_OTHER): Payer: BC Managed Care – PPO

## 2010-05-28 ENCOUNTER — Ambulatory Visit (INDEPENDENT_AMBULATORY_CARE_PROVIDER_SITE_OTHER): Payer: BC Managed Care – PPO | Admitting: Gastroenterology

## 2010-05-28 ENCOUNTER — Encounter: Payer: Self-pay | Admitting: Gastroenterology

## 2010-05-28 ENCOUNTER — Telehealth: Payer: Self-pay | Admitting: Gastroenterology

## 2010-05-28 DIAGNOSIS — K219 Gastro-esophageal reflux disease without esophagitis: Secondary | ICD-10-CM

## 2010-05-28 DIAGNOSIS — E538 Deficiency of other specified B group vitamins: Secondary | ICD-10-CM

## 2010-05-28 DIAGNOSIS — K589 Irritable bowel syndrome without diarrhea: Secondary | ICD-10-CM

## 2010-05-28 LAB — CBC WITH DIFFERENTIAL/PLATELET
Basophils Absolute: 0 10*3/uL (ref 0.0–0.1)
Eosinophils Absolute: 0.2 10*3/uL (ref 0.0–0.7)
Eosinophils Relative: 1.7 % (ref 0.0–5.0)
HCT: 40.6 % (ref 36.0–46.0)
Lymphs Abs: 1.5 10*3/uL (ref 0.7–4.0)
MCV: 89.8 fl (ref 78.0–100.0)
Monocytes Absolute: 0.6 10*3/uL (ref 0.1–1.0)
Neutrophils Relative %: 78.4 % — ABNORMAL HIGH (ref 43.0–77.0)
Platelets: 214 10*3/uL (ref 150.0–400.0)
RDW: 14 % (ref 11.5–14.6)
WBC: 10.6 10*3/uL — ABNORMAL HIGH (ref 4.5–10.5)

## 2010-05-28 LAB — HEPATIC FUNCTION PANEL
ALT: 18 U/L (ref 0–35)
AST: 25 U/L (ref 0–37)
Alkaline Phosphatase: 68 U/L (ref 39–117)
Total Bilirubin: 0.6 mg/dL (ref 0.3–1.2)

## 2010-05-28 LAB — BASIC METABOLIC PANEL
BUN: 13 mg/dL (ref 6–23)
CO2: 31 mEq/L (ref 19–32)
Chloride: 99 mEq/L (ref 96–112)
Creatinine, Ser: 1 mg/dL (ref 0.4–1.2)
Potassium: 4.4 mEq/L (ref 3.5–5.1)

## 2010-05-28 LAB — VITAMIN B12: Vitamin B-12: 649 pg/mL (ref 211–911)

## 2010-05-28 LAB — FERRITIN: Ferritin: 21 ng/mL (ref 10.0–291.0)

## 2010-05-28 LAB — IBC PANEL: Iron: 79 ug/dL (ref 42–145)

## 2010-05-28 MED ORDER — CILIDINIUM-CHLORDIAZEPOXIDE 2.5-5 MG PO CAPS: 1.0000 | ORAL_CAPSULE | Freq: Three times a day (TID) | ORAL | Status: DC | PRN

## 2010-05-28 MED ORDER — SUCRALFATE 1 GM/10ML PO SUSP: 1.0000 g | Freq: Four times a day (QID) | ORAL | Status: DC | PRN

## 2010-05-28 NOTE — Telephone Encounter (Signed)
i can not find that medicaiton on the list when patient comes in we will have her bring her bottles.

## 2010-05-28 NOTE — Patient Instructions (Signed)
Please go to the basement today for your labs. , Your prescription(s) have been sent to you pharmacy.  Call back to schedule your Endoscopy, the number is [phone]. You will need a previsit.

## 2010-06-01 ENCOUNTER — Encounter: Payer: Self-pay | Admitting: Gastroenterology

## 2010-06-01 DIAGNOSIS — E538 Deficiency of other specified B group vitamins: Secondary | ICD-10-CM | POA: Insufficient documentation

## 2010-06-01 DIAGNOSIS — K219 Gastro-esophageal reflux disease without esophagitis: Secondary | ICD-10-CM | POA: Insufficient documentation

## 2010-06-01 NOTE — Progress Notes (Signed)
History of Present Illness: This is a 45 Caucasian female with chronic acid reflux refractory to PPI therapy. He has upcoming surgery on her foot, but denies abuse of NSAIDs, cigarettes or alcohol. She also has continued vague epigastric and right upper quadrant abdominal pain the previous negative GI workup. She denies dysphagia, hepatobiliary complaints, or gastrointestinal symptoms. He does have a history of B12 deficiency is on replacement therapy. She also has a history of chronic IBS.  Current Medications, Allergies, Past Medical History, Past Surgical History, Family History and Social History were reviewed in [employer] record.   Assessment and plan: Refractory GERD, rule out enlarging hiatal hernia, H. pylori infection, versus functional problems. Labs have been ordered and an endoscopic exam has been recommended. Encounter Diagnoses  Name Primary?  . Esophageal reflux   . Irritable bowel syndrome   . Vitamin B12 deficiency

## 2010-06-07 ENCOUNTER — Telehealth: Payer: Self-pay | Admitting: Gastroenterology

## 2010-06-07 NOTE — Telephone Encounter (Signed)
Pt is calling and wants to know her lab results. Let pt know that Dr. Jarold Motto is out of the office but that we would call her back when he returns with the results.

## 2010-06-09 ENCOUNTER — Telehealth: Payer: Self-pay | Admitting: Gastroenterology

## 2010-06-09 NOTE — Telephone Encounter (Signed)
Labs normal.

## 2010-06-09 NOTE — Telephone Encounter (Signed)
Taylor Yates is calling patient about her labs, Per DRP all normal.

## 2010-06-09 NOTE — Telephone Encounter (Signed)
I have left a voicemail for the patient that her labs were normal and no longer needs B12 injections.  She was asked to call back for any questions.

## 2010-06-14 ENCOUNTER — Telehealth: Payer: Self-pay | Admitting: Gastroenterology

## 2010-06-14 DIAGNOSIS — K219 Gastro-esophageal reflux disease without esophagitis: Secondary | ICD-10-CM

## 2010-06-14 DIAGNOSIS — E538 Deficiency of other specified B group vitamins: Secondary | ICD-10-CM

## 2010-06-14 DIAGNOSIS — K589 Irritable bowel syndrome without diarrhea: Secondary | ICD-10-CM

## 2010-06-14 MED ORDER — CILIDINIUM-CHLORDIAZEPOXIDE 2.5-5 MG PO CAPS: 1.0000 | ORAL_CAPSULE | Freq: Three times a day (TID) | ORAL | Status: DC | PRN

## 2010-06-14 NOTE — Telephone Encounter (Signed)
Pt's mom called back to report they never got the script filled for Librax? Reordered to [facility] [address] per mom.

## 2010-06-14 NOTE — Telephone Encounter (Signed)
Addended by: Graciella Freer on: 06/14/2010 09:33 AM   Modules accepted: Orders

## 2010-06-14 NOTE — Telephone Encounter (Signed)
Pt stated Saturday afternoon she developed severs stomach pain that radiated to her back. Pt had some Hydrocodone from recent knee surgery that relieved the pain. This am around 4am, the pain started again.Pt was given Librax on 05/28/10 OV, but stated she hasn't been able to work taking the drug in her schedule. Pt denies diarrhea and stated she isn't having "good BM's". I asked if she was taking the Hydrocodone "regularly" and she stated no. Informed pt narcotics can slow the bowel down and I suggested she take stool softeners once or twice daily. I also instructed her to fill the Librax and begin taking it as ordered and see if that helps her pain; call us [place] pain doesn't get better. Pt stated understanding.

## 2010-06-15 ENCOUNTER — Inpatient Hospital Stay (INDEPENDENT_AMBULATORY_CARE_PROVIDER_SITE_OTHER)
Admission: RE | Admit: 2010-06-15 | Discharge: 2010-06-15 | Disposition: A | Discharge status: Home / Self Care | Payer: BC Managed Care – PPO | Source: Ambulatory Visit | Attending: Family Medicine | Admitting: Family Medicine

## 2010-06-15 ENCOUNTER — Telehealth: Payer: Self-pay | Admitting: *Deleted

## 2010-06-15 DIAGNOSIS — K589 Irritable bowel syndrome without diarrhea: Secondary | ICD-10-CM

## 2010-06-15 NOTE — Telephone Encounter (Signed)
Pt's mother called again to report the Librax has not helped the abdominal pain that radiates to pt's back that was reported yesterday. I then spoke with the pt who reports the pain began Saturday early am and she had to take Hydrocodone from her recent knee surgery to relieve it. The pain recurred again yesterday at 4:26am. She reports early satiety and the stool softeners haven't helped her have a "good BM".  Pt's mom stated the pain is worse when the pt lies down and is better when she is standing. Mom thinks pt may have a blockage. Informed pt and mom we have no appts today with the mid level or Dr Jarold Motto and she should go to the ER or an UC. Mom called back to see if one of our other doctors will see pt. Dr Christella Hartigan is on call and has no openings. Mom wanted a copy of pt's records to take with pt and I suggested she go to [facility] and they can pull up our records. Mom verbalized understanding.

## 2010-06-16 ENCOUNTER — Telehealth: Payer: Self-pay | Admitting: *Deleted

## 2010-06-16 ENCOUNTER — Other Ambulatory Visit: Payer: Self-pay | Admitting: Gastroenterology

## 2010-06-16 ENCOUNTER — Emergency Department (HOSPITAL_COMMUNITY): Payer: BC Managed Care – PPO

## 2010-06-16 ENCOUNTER — Emergency Department (HOSPITAL_COMMUNITY)
Admission: EM | Admit: 2010-06-16 | Discharge: 2010-06-16 | Disposition: A | Discharge status: Home / Self Care | Payer: BC Managed Care – PPO | Attending: Emergency Medicine | Admitting: Emergency Medicine

## 2010-06-16 DIAGNOSIS — K8020 Calculus of gallbladder without cholecystitis without obstruction: Secondary | ICD-10-CM | POA: Insufficient documentation

## 2010-06-16 DIAGNOSIS — R109 Unspecified abdominal pain: Secondary | ICD-10-CM | POA: Insufficient documentation

## 2010-06-16 LAB — CBC
MCHC: 33.6 g/dL (ref 30.0–36.0)
MCV: 88.7 fL (ref 78.0–100.0)
Platelets: 238 10*3/uL (ref 150–400)
RDW: 13.4 % (ref 11.5–15.5)
WBC: 9.8 10*3/uL (ref 4.0–10.5)

## 2010-06-16 LAB — COMPREHENSIVE METABOLIC PANEL
Albumin: 3.8 g/dL (ref 3.5–5.2)
Alkaline Phosphatase: 72 U/L (ref 39–117)
BUN: 14 mg/dL (ref 6–23)
Calcium: 10 mg/dL (ref 8.4–10.5)
Potassium: 4.4 mEq/L (ref 3.5–5.1)
Total Protein: 8.1 g/dL (ref 6.0–8.3)

## 2010-06-16 LAB — URINE MICROSCOPIC-ADD ON

## 2010-06-16 LAB — URINALYSIS, ROUTINE W REFLEX MICROSCOPIC
Nitrite: NEGATIVE
Specific Gravity, Urine: 1.009 (ref 1.005–1.030)
Urobilinogen, UA: 0.2 mg/dL (ref 0.0–1.0)
pH: 6.5 (ref 5.0–8.0)

## 2010-06-16 LAB — POCT PREGNANCY, URINE: Preg Test, Ur: NEGATIVE

## 2010-06-16 LAB — DIFFERENTIAL
Lymphs Abs: 1.7 10*3/uL (ref 0.7–4.0)
Monocytes Relative: 7 % (ref 3–12)
Neutro Abs: 7.1 10*3/uL (ref 1.7–7.7)
Neutrophils Relative %: 73 % (ref 43–77)

## 2010-06-16 NOTE — Telephone Encounter (Signed)
Pt's mom called to report pt went to the ER and pt was dx with Gall Bladder disease. Mom wants to know if Dr Jarold Motto can recommend surgeon who will do the Cholecystectomy and Hernia Repair at the same time. [phone] mom     [phone] pt  Informed Mom Dr Jarold Motto recommends pt have Cholecystectomy done and he will discuss the Hernia Repair later. Pt has an appt with Dr Johna Sheriff on Monday. Mom verbalized understanding.

## 2010-06-22 NOTE — Assessment & Plan Note (Signed)
[facility]                                 ON-CALL NOTE   NAZIRAH, TRI                    MRN:          [id number]  DATE:05/02/2007                            DOB:          Jun 10, 1980    Ms. Enlow called tonight stating that she has a gas pocket that is  moving upwards instead of downwards.  She is without abdominal pain per  se.  She asked whether she could take some Gas-X.  I told her that there  is no problem with that, and that she should call back if she is having  abdominal pain or fever.     Barbette Hair. Arlyce Dice, MD,FACG  Electronically Signed    RDK/MedQ  DD: 05/02/2007  DT: 05/03/2007  Job #: [id number]   cc:   Vania Rea. Jarold Motto, MD, Caleen Essex, FAGA

## 2010-06-22 NOTE — Op Note (Signed)
NAMERHEGAN, Taylor Yates NO.:  [id number]   MEDICAL RECORD NO.:  [id number]          PATIENT TYPE:  INP   LOCATION:  0098                         FACILITY:  [facility]   PHYSICIAN:  Adolph Pollack, M.D.DATE OF BIRTH:  08-03-1980   DATE OF PROCEDURE:  10/28/2006  DATE OF DISCHARGE:                               OPERATIVE REPORT   PREOPERATIVE DIAGNOSIS:  Acute appendicitis.   POSTOPERATIVE DIAGNOSIS:  Acute appendicitis.   PROCEDURE:  Laparoscopic appendectomy.   SURGEON:  Adolph Pollack, M.D.   ANESTHESIA:  General.   INDICATIONS:  This 30 year old female presented to the emergency room  with some right lower quadrant pain, leukocytosis, and CT scan was  ordered and found to be consistent with appendicitis.  She is now  brought to the operating room for appendectomy.   TECHNIQUE:  She is brought to the operating room and placed supine on  the operating table.  General anesthetic was administered.  A Foley  catheter was placed in the bladder.  The abdominal wall was sterilely  prepped and draped.  Marcaine solution was infiltrated in the  subumbilical region.  A subumbilical incision was made through the skin,  subcutaneous tissue, fascia and peritoneum entering the peritoneal  cavity.  A pursestring suture of 0 Vicryl was placed around the fascial  edges.  A Hassan trocar was introduced into the peritoneal cavity and  pneumoperitoneum created by insufflation of CO2 gas.   Next, the laparoscope was introduced.  I could see the tip of the  appendix appeared to be injected and acutely inflamed but not  perforated.  A 5 mm trocar was placed in the left lower quadrant just  lateral to midline.  A 5 mm trocar was placed in the right upper  quadrant.  The mesoappendix was grasped and retracted anteriorly.  I  then divided the mesoappendix toward the base of the cecum with the  harmonic scalpel.  Using the Endo-GIA stapler, the appendix was  amputated off the  cecum and placed in an Endopouch bag.  It was removed  through the subumbilical port.   Following this, I replaced the subumbilical trocar.  I copiously  irrigated out the right lower quadrant region and noticed a little bit  of bleeding from the staple line which was controlled with hemoclips.  I  then evacuated the fluid.  I re-examined the staple line again and there  was no bleeding, there was no leak.  Following this, I removed the left  lower quadrant trocar, no bleeding was noted.  The [facility] trocar was  removed and the fascial defect in the subumbilical area was closed under  laparoscopic vision by tightening up and tying down the pursestring  suture.  The pneumoperitoneum was released and the remaining trocar  removed.   The skin incisions were closed with 4-0 Monocryl subcuticular stitches  followed by Steri-Strips and sterile dressings.  She tolerated the  procedure well without apparent complications.  She was taken to the  recovery room in satisfactory condition.      Adolph Pollack, M.D.  Electronically Signed     TJR/MEDQ  D:  10/28/2006  T:  10/28/2006  Job:  [id number]

## 2010-06-22 NOTE — Assessment & Plan Note (Signed)
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 Barbette Hair. Artist Pais, for evaluation of  chronic reflux, dysphagia, globus sensation in her throat and chronic  irritable bowel syndrome with alternating diarrhea and constipation.   For the last several years, Taylor Yates has had rather typical reflux with  burning substernal chest pain and regurgitation both day and night time.  She has had a globus sensation in her throat, associated with  intermittent solid food dysphagia.  She has not had x-ray or endoscopic  examinations of her esophagus.  She has really not been on any  medications except for antacids.  She denied any specific hepatobiliary  complaints or clay-colored stools, dark urine, icterus, fever or chills.  Her appetite is good and her weight is stable.  She denies any specific  food intolerances.  She has rather classic IBS with alternating diarrhea  and constipation, with associated gas and bloating.  She at times does  see some mucus and blood in her stool.  Again, she has not had any  endoscopic or barium studies of her bowels.  She is status post  emergency appendectomy by Dr. Adolph Pollack in September 2008.   She has recently been seen by Dr. Artist Pais and exam was unremarkable.   LABORATORY DATA:  A review of her laboratory data from September showed  a normal CBC, metabolic profile and abdominal CT scan.  A pregnancy test  at that time was normal.  Thyroid function tests were normal.  Serum  iron levels were normal.  Pathology report of her appendix showed acute  appendicitis, without evidence of inflammatory bowel disease.   The patient recently has been undergoing a thyroid  workup by Dr.  Artist Pais,  and she also has a history of attention deficit disorder and a history  of genital warts.  She also had more recent blood work done on February 12, 2007.  This again showed a normal CBC, metabolic profile and TSH  level.   PAST MEDICAL HISTORY:  Otherwise is noncontributory except for chronic  headaches.  She is having regular menstrual periods.   FAMILY HISTORY:  Unknown because the patient is adopted.   SOCIAL HISTORY:  She is single and lives by herself.  She has a B.S.  degree and works as a Set designer.  She does not smoke  or use Ethanol.   REVIEW OF SYSTEMS:  Otherwise noncontributory except for some vague  arthralgias and chronic fatigue.  She does have some deconditioning with  shortness of breath on exertion, also has painful menses.  She denies  any current cardiovascular, pulmonary or neuropsychiatric problems.  The  review of systems otherwise noncontributory.   PHYSICAL EXAMINATION:  GENERAL:  She is a healthy-appearing white  female, in no distress, appearing her stated age.  VITAL SIGNS:  She  is 5 feet 4 inches and weighs 222 pounds.  Blood  pressure 110/80, pulse 72 and regular.  NECK:  I could not appreciate thyromegaly or lymphadenopathy.  CHEST:  Entirely clear.  HEART:  A regular rhythm without murmurs, gallops or rubs.  ABDOMEN:  I could not appreciate hepatosplenomegaly, abdominal masses or  tenderness.  EXTREMITIES:  Peripheral extremities were unremarkable.  NEUROLOGIC:  Mental status was clear.  RECTAL:  Inspection of the rectum was unremarkable, as was the rectal  exam and stool is guaiac-negative.   ASSESSMENT:  1. Chronic gastroesophageal reflux disease with associated dysplasia -      rule out Barrett's mucosa and esophageal stricture.  2. Irritable bowel syndrome with intermittent rectal bleeding - rule      out colon polyps, although I suspect her bleeding is coming from      local anal irritation with a  history of genital warts.  3. History of appendectomy in September 2008.   MEDICAL DECISION MAKING:  1. A high-fiber diet with daily Benefiber and liberal p.o. fluids.  2. Reflux regimen along with Capodex 60 mg daily. She also will be      placed on an antireflux regimen.  3. Outpatient endoscopy and colonoscopy.  4. Will check an anemia profile, a celiac panel and sedimentation      rate.     Taylor Rea. Jarold Motto, MD, Caleen Essex, FAGA  Electronically Signed    DRP/MedQ  DD: 04/03/2007  DT: 04/03/2007  Job #: [id number]   cc:   Barbette Hair. Artist Pais, DO

## 2010-06-22 NOTE — Assessment & Plan Note (Signed)
[facility]                           PRIMARY CARE OFFICE NOTE   IVA, MONTELONGO                    MRN:          [id number]  DATE:08/21/2006                            DOB:          1980/05/31    CHIEF COMPLAINT:  New patient to practice, weight gain.   HISTORY OF PRESENT ILLNESS:  The patient is a 30 year old white female  here to establish primary care.  She is concerned about steady weight  gain over the last 5-6 years.  She notes in early 2000 she weighed 130-  140 pounds, and she is at her current weight of 219 pounds.  Associated  with weight gain has been symptoms of hair loss and cold sensitivity and  constipation.  She also complains of some fatigue and memory loss.  She  did have some recent blood work with her gynecologist which may have  confirmed issues of hypothyroidism, but those laboratory results are  unavailable.   Her other major concern has been ongoing issues with attention deficit  disorder.  Since age 43 to age 36, she was on Ritalin.  This was  prescribed by a psychiatrist.  She has discontinued Ritalin and  currently is experiencing significant difficulty in completing tasks.  She currently works as a Psychologist, educational and was written up for  failure to timely complete tasks.   PAST MEDICAL HISTORY:  1. Remote history of iron deficiency anemia.  2. History of fainting spells.  3. Gastroesophageal reflux disease.  4. History of genital warts.  5. Jaundice as an infant.  6. History of Bell's palsy.  7. History of urinary tract infections.   CURRENT MEDICATIONS:  None.   ALLERGIES:  PREDNISONE CAUSES FLUID RETENTION, JOINT PAIN AND WEIGHT  GAIN.   SOCIAL HISTORY:  The patient is single, currently living with two cats.   FAMILY HISTORY:  She was adopted and is unclear of her family's medical  history.   HABITS:  No alcohol.  No tobacco.  No recreational drug use.   PREVENTATIVE CARE HISTORY:  Her last  Pap was in February 2008.  She  denies ever having abnormal Pap.   REVIEW OF SYSTEMS:  As noted above.  Has frequent heartburn that she  attributes to her increased weight.  Denies any chest pain,  palpitations, shortness of breath.  No dysuria, frequency, urgency and  all systems negative.   PHYSICAL EXAMINATION:  VITAL SIGNS:  Height 5 feet 4 inches, weight 219  pounds, temperature 97.9, pulse 73, blood pressure 112/78 in the left  arm in the seated position.  GENERAL:  The patient is a very pleasant, obese, 30 year old white  female.  No apparent distress.  HEENT:  Normocephalic, atraumatic.  Pupils equal, round and reactive to  light bilaterally.  Extraocular mobility intact.  The patient was  anicteric.  Conjunctivae within normal limits.  External auditory canals  were inspected and clear bilaterally.  NECK:  Somewhat thickened, but I could not appreciate any thyromegaly or  thyroid nodules.  CHEST:  Normal Respiratory effort.  Clear to auscultation bilaterally  without any evidence of rhonchi, rales  or wheezes.  CARDIOVASCULAR:  Regular rate and rhythm.  No significant murmurs, rubs  or gallops appreciated.  ABDOMEN:  Soft, nontender, negative bowel sounds.  No organomegaly.  EXTREMITIES:  No cyanosis, clubbing or edema.  SKIN:  Warm and dry.  NEUROLOGICAL:  Cranial nerves II-XII intact.   IMPRESSION/RECOMMENDATIONS:  1. Weight gain.  2. Possible hypothyroidism.  3. ADD.  4. Health Maintenance.   RECOMMENDATIONS:  The patient will forward [place] a copy of most recent  labs.  If patient is indeed hypothyroid, we discussed starting thyroid  hormone replacement.   Her symptoms are consistent with ADD and we discussed several potential  medications.  We elected to start Adderall XR 20 mg to be taken once a  day.   We will arrange follow up in approximately 4-6 weeks.     Barbette Hair. Artist Pais, DO  Electronically Signed    RDY/MedQ  DD: 08/21/2006  DT: 08/22/2006  Job #:  [id number]

## 2010-06-22 NOTE — H&P (Signed)
NAMEALONIA, DIBUONO NO.:  [id number]   MEDICAL RECORD NO.:  [id number]          PATIENT TYPE:  EMS   LOCATION:  ED                           FACILITY:  [facility]   PHYSICIAN:  Adolph Pollack, M.D.DATE OF BIRTH:  August 21, 1980   DATE OF ADMISSION:  10/27/2006  DATE OF DISCHARGE:                              HISTORY & PHYSICAL   CHIEF COMPLAINT:  Abdominal pain.   HISTORY OF PRESENT ILLNESS:  This 30 year old female, who is otherwise  healthy, awoke this morning with generalized abdominal discomfort that  felt like indigestion.  She tried some Naprosyn without relief.  The  discomfort persisted then radiated to her right lower quadrant and  intensified.  She was seen in a walk-in clinic and noted to have  leukocytosis and was sent to the emergency room where leukocytosis was  confirmed.  CT scan was performed and was consistent with acute  appendicitis without perforation and I was asked to see her.   She states she has not had any fever, a little nausea, no anorexia, no  diarrhea, no dysuria.   PAST MEDICAL HISTORY:  Hyperlipidemia.   PREVIOUS OPERATIONS:  D and C.   ALLERGIES:  NONE KNOWN.   MEDICATIONS:  None.   SOCIAL HISTORY:  Single.  Works at [employer].  No tobacco or alcohol use.   FAMILY HISTORY:  Unknown as she is adopted.   REVIEW OF SYSTEMS:  CARDIAC:  No known heart disease or hypertension.  PULMONARY:  No pneumonia, asthma, TB.  GI:  She states she may have  irritable bowel but no peptic ulcer disease or hepatitis.  GU: No kidney  stones, urinary tract infections.  ENDOCRINE:  No diabetes.  NEUROLOGIC:  No strokes or seizures.  HEMATOLOGIC:  No bleeding disorders, blood  clots or transfusions.   PHYSICAL EXAM:  GENERAL:  A moderately overweight female in no acute  distress, pleasant and cooperative.  VITAL SIGNS:  Temperature is 98.8,  blood pressure 122/79, pulse 86.  EYES:  Extraocular motions intact.  No icterus.  NECK:  Supple without  masses or thyroid enlargement.  RESPIRATORY:  Breath sounds equal and clear.  Respirations unlabored.  CARDIOVASCULAR:  Demonstrates a regular rate, regular rhythm.  No murmur  heard.  No JVD.  ABDOMEN:  Soft.  Somewhat obese.  There is right lower  quadrant tenderness and guarding and a Rovsing's sign.  No obturator  sign.  No palpable masses.  MUSCULOSKELETAL:  Good muscle tone, range of motion.  SKIN:  No jaundice.  NEUROLOGIC:  Alert, oriented, answers questions appropriately.   PREOP LABORATORY DATA:  Notable for white blood cell count of 13,500  with a hemoglobin of 13.3.  urinalysis is negative.  Urine pregnancy  negative.  CT reviewed.  Electrolytes within normal limits.   IMPRESSION:  Acute appendicitis, does not appear to be perforated at  this time.   PLAN:  I will give IV antibiotics and then to the operating room for  laparoscopic, possible open, appendectomy.  I have discussed the  procedure and the risks with her.  The risks include but are not limited  to  bleeding, infection, wound healing problems, anesthesia, accidental  damage to intra-abdominal organs.  I also had talked about postoperative  care.  She understand this and agrees to proceed.      Adolph Pollack, M.D.  Electronically Signed     TJR/MEDQ  D:  10/27/2006  T:  10/29/2006  Job:  [id number]   cc:   Barbette Hair. [place], DO  [address] [place], [place] [address]

## 2010-07-09 HISTORY — PX: CHOLECYSTECTOMY: SHX55

## 2010-07-21 ENCOUNTER — Other Ambulatory Visit (INDEPENDENT_AMBULATORY_CARE_PROVIDER_SITE_OTHER): Payer: Self-pay | Admitting: General Surgery

## 2010-07-30 ENCOUNTER — Encounter (INDEPENDENT_AMBULATORY_CARE_PROVIDER_SITE_OTHER): Payer: Self-pay | Admitting: General Surgery

## 2010-08-13 ENCOUNTER — Ambulatory Visit (INDEPENDENT_AMBULATORY_CARE_PROVIDER_SITE_OTHER): Payer: BC Managed Care – PPO | Admitting: General Surgery

## 2010-08-13 ENCOUNTER — Encounter (INDEPENDENT_AMBULATORY_CARE_PROVIDER_SITE_OTHER): Payer: Self-pay | Admitting: General Surgery

## 2010-08-13 VITALS — BP 106/74 | HR 68 | Temp 97.9°F | Resp 18 | Ht 64.0 in | Wt 200.0 lb

## 2010-08-13 DIAGNOSIS — Z9889 Other specified postprocedural states: Secondary | ICD-10-CM

## 2010-08-13 NOTE — Patient Instructions (Signed)
Call as needed 

## 2010-08-13 NOTE — Progress Notes (Signed)
This patient returns following laparoscopic cholecystectomy performed on 6 1312. She had a rash from the prep but this has resolved. Her postoperative nausea and pain has resolved and she has had no recurrence of her preoperative abdominal pain.  On examination she appears well. Her abdomen is soft and nontender and wounds all well healed.  Final pathology confirmed chronic cholecystitis cholelithiasis and cholesterolosis.  She has done well following her laparoscopic cholecystectomy with no complications identified. She is discharged return as needed.

## 2010-11-18 LAB — CBC
Hemoglobin: 13.3
MCHC: 34.8
MCV: 87
RBC: 4.39
RDW: 13.1

## 2010-11-18 LAB — DIFFERENTIAL
Basophils Absolute: 0
Basophils Relative: 0
Eosinophils Absolute: 0
Monocytes Absolute: 0.3
Monocytes Relative: 2 — ABNORMAL LOW
Neutro Abs: 11.9 — ABNORMAL HIGH
Neutrophils Relative %: 88 — ABNORMAL HIGH

## 2010-11-18 LAB — BASIC METABOLIC PANEL
CO2: 24
Calcium: 9.5
Chloride: 99
Creatinine, Ser: 0.79
Glucose, Bld: 98

## 2010-11-18 LAB — URINALYSIS, ROUTINE W REFLEX MICROSCOPIC
Bilirubin Urine: NEGATIVE
Glucose, UA: NEGATIVE
Hgb urine dipstick: NEGATIVE
Protein, ur: NEGATIVE
Urobilinogen, UA: 0.2

## 2010-12-14 ENCOUNTER — Other Ambulatory Visit: Payer: Self-pay | Admitting: Gastroenterology

## 2010-12-14 MED ORDER — DEXLANSOPRAZOLE 60 MG PO CPDR: DELAYED_RELEASE_CAPSULE | ORAL | Status: DC

## 2010-12-14 NOTE — Telephone Encounter (Signed)
Notified pt's mom that I ordered the med for 90 day supply.

## 2011-06-28 ENCOUNTER — Telehealth: Payer: Self-pay | Admitting: Gastroenterology

## 2011-06-28 NOTE — Telephone Encounter (Signed)
Patient's mother called at the request of patient requesting samples of Dexilant. I have advised patient's mother that Dr Jarold Motto requested patient have an endoscopy over 1 year ago at her office visit on 05-28-11 due to chronic reflux. Patient's mother states that patient was never told this. I have explained that she was told as this was written on her patient instruction sheet. Patient was to call back and schedule an appointment. Mother states that patient's GERD is now controlled with Dexilant and wants to know if Dr Jarold Motto really needs the test or if she can just continue getting Dexilant. I have advised her that chances are, Dr Jarold Motto does still want the endoscopy to assure that there are no abnormalities in the stomach or esophagus but that I would double check with him.

## 2011-06-28 NOTE — Telephone Encounter (Signed)
Left message for patient to call back  

## 2011-06-28 NOTE — Telephone Encounter (Signed)
Needs endo

## 2011-06-28 NOTE — Telephone Encounter (Signed)
Patient has rescheduled endoscopy to 08/03/11. She is actually still having GERD refractory to Dexilant as well. I will put enough Dexilant samples at the front desk for patient to take until her appointment on 08/03/11 as per Dr Norval Gable wishes. Understanding has been verbalized.

## 2011-07-18 ENCOUNTER — Telehealth: Payer: Self-pay | Admitting: Gastroenterology

## 2011-07-19 NOTE — Telephone Encounter (Signed)
Pt has Pre Visit tomorrow for EGD on 08/03/11. Pt is having problems with LLQ pain after eating sometimes. She is post Cholecystectomy and watches her diet; diarrhea can accompany the pain. Pt wants to know if she can have a COLON with the EGD?  Explained to pt Dr Jarold Motto is away this week, but I will send a note and get back with her. Changed schedule to reflect a ECL and I can always change back to EGD if Dr Jarold Motto chooses; pt stated understanding. Dr Jarold Motto, OK to add COLON ? Thanks.

## 2011-07-20 ENCOUNTER — Ambulatory Visit (AMBULATORY_SURGERY_CENTER): Payer: BC Managed Care – PPO | Admitting: *Deleted

## 2011-07-20 VITALS — Ht 64.0 in | Wt 200.6 lb

## 2011-07-20 DIAGNOSIS — K219 Gastro-esophageal reflux disease without esophagitis: Secondary | ICD-10-CM

## 2011-07-21 ENCOUNTER — Encounter: Payer: Self-pay | Admitting: Gastroenterology

## 2011-07-24 NOTE — Telephone Encounter (Signed)
ok 

## 2011-07-25 NOTE — Telephone Encounter (Signed)
Informed pt's mom Dr Jarold Motto will do the COLON with EGD on 08/03/11. lmom for pt on cell informing her of this and she can call back for questions.

## 2011-07-27 ENCOUNTER — Telehealth: Payer: Self-pay | Admitting: Gastroenterology

## 2011-07-27 ENCOUNTER — Encounter: Payer: Self-pay | Admitting: *Deleted

## 2011-07-27 NOTE — Telephone Encounter (Signed)
Pt.s instructions mailed to pt. And she will review and call back for review with nurse.Prep called to [facility] on [address].

## 2011-08-03 ENCOUNTER — Ambulatory Visit (AMBULATORY_SURGERY_CENTER): Payer: BC Managed Care – PPO | Admitting: Gastroenterology

## 2011-08-03 ENCOUNTER — Other Ambulatory Visit: Payer: Self-pay

## 2011-08-03 ENCOUNTER — Encounter: Payer: Self-pay | Admitting: Gastroenterology

## 2011-08-03 VITALS — BP 97/73 | HR 72 | Temp 98.0°F | Resp 16 | Ht 64.0 in | Wt 200.0 lb

## 2011-08-03 DIAGNOSIS — R109 Unspecified abdominal pain: Secondary | ICD-10-CM

## 2011-08-03 DIAGNOSIS — K219 Gastro-esophageal reflux disease without esophagitis: Secondary | ICD-10-CM

## 2011-08-03 DIAGNOSIS — D126 Benign neoplasm of colon, unspecified: Secondary | ICD-10-CM

## 2011-08-03 DIAGNOSIS — D1330 Benign neoplasm of unspecified part of small intestine: Secondary | ICD-10-CM

## 2011-08-03 DIAGNOSIS — R197 Diarrhea, unspecified: Secondary | ICD-10-CM

## 2011-08-03 MED ORDER — COLESEVELAM HCL 625 MG PO TABS: ORAL_TABLET | ORAL | Status: DC

## 2011-08-03 MED ORDER — SODIUM CHLORIDE 0.9 % IV SOLN: 500.0000 mL | INTRAVENOUS | Status: DC

## 2011-08-03 NOTE — Progress Notes (Signed)
Patient did not experience any of the following events: a burn prior to discharge; a fall within the facility; wrong site/side/patient/procedure/implant event; or a hospital transfer or hospital admission upon discharge from the facility. (G8907) Patient did not have preoperative order for IV antibiotic SSI prophylaxis. (G8918)  

## 2011-08-03 NOTE — Op Note (Signed)
[facility] [address] [place], [place]  [address]  COLONOSCOPY PROCEDURE REPORT  PATIENT:  Leotha, Westermeyer  MR#:  [id number] BIRTHDATE:  1980/06/29, 31 yrs. old  GENDER:  female ENDOSCOPIST:  Vania Rea. Jarold Motto, MD, [facility] REF. BY: PROCEDURE DATE:  08/03/2011 PROCEDURE:  Colonoscopy with biopsy ASA CLASS:  Class II INDICATIONS:  DIARRHEA S/P CHOLECYSTECTOMY,ABD PAIN MEDICATIONS:   propofol (Diprivan) 200 mg IV  DESCRIPTION OF PROCEDURE:   After the risks and benefits and of the procedure were explained, informed consent was obtained. Digital rectal exam was performed and revealed no abnormalities. The LB PCF-H180AL [id number] endoscope was introduced through the anus and advanced to the cecum, which was identified by both the appendix and ileocecal valve.  The quality of the prep was excellent, using MoviPrep.  The instrument was then slowly withdrawn as the colon was fully examined. <<PROCEDUREIMAGES>>  FINDINGS:  No polyps or cancers were seen.  This was otherwise a normal examination of the colon. RANDOM BIOPSIES EVERY 10 CM. DONE.   Retroflexed views in the rectum revealed no abnormalities. The scope was then withdrawn from the patient and the procedure completed.  COMPLICATIONS:  None ENDOSCOPIC IMPRESSION: 1) No polyps or cancers 2) Otherwise normal examination CHRONIC IBS,PROBALE BILE-SALT ENTEROPATHY RECOMMENDATIONS: 1) Await biopsy results WELCHOL DAILY  REPEAT EXAM:  No  ______________________________ Vania Rea. Jarold Motto, MD, [name]  CC:  Thomos Lemons, Ferne Coe, MD  n. [name]:   Vania Rea. Shigeko Manard at 08/03/2011 10:46 AM  Helmut Muster, [phone]

## 2011-08-03 NOTE — Patient Instructions (Addendum)
Discharge instructions given with verbal understanding. Literature given. Resume previous medications. Samples given. YOU HAD AN ENDOSCOPIC PROCEDURE TODAY AT THE [facility]: Refer to the procedure report that was given to you for any specific questions about what was found during the examination.  If the procedure report does not answer your questions, please call your gastroenterologist to clarify.  If you requested that your care partner not be given the details of your procedure findings, then the procedure report has been included in a sealed envelope for you to review at your convenience later.  YOU SHOULD EXPECT: Some feelings of bloating in the abdomen. Passage of more gas than usual.  Walking can help get rid of the air that was put into your GI tract during the procedure and reduce the bloating. If you had a lower endoscopy (such as a colonoscopy or flexible sigmoidoscopy) you may notice spotting of blood in your stool or on the toilet paper. If you underwent a bowel prep for your procedure, then you may not have a normal bowel movement for a few days.  DIET: Your first meal following the procedure should be a light meal and then it is ok to progress to your normal diet.  A half-sandwich or bowl of soup is an example of a good first meal.  Heavy or fried foods are harder to digest and may make you feel nauseous or bloated.  Likewise meals heavy in dairy and vegetables can cause extra gas to form and this can also increase the bloating.  Drink plenty of fluids but you should avoid alcoholic beverages for 24 hours.  ACTIVITY: Your care partner should take you home directly after the procedure.  You should plan to take it easy, moving slowly for the rest of the day.  You can resume normal activity the day after the procedure however you should NOT DRIVE or use heavy machinery for 24 hours (because of the sedation medicines used during the test).    SYMPTOMS TO REPORT IMMEDIATELY: A  gastroenterologist can be reached at any hour.  During normal business hours, 8:30 AM to 5:00 PM Monday through Friday, call [phone].  After hours and on weekends, please call the GI answering service at [phone] who will take a message and have the physician on call contact you.   Following lower endoscopy (colonoscopy or flexible sigmoidoscopy):  Excessive amounts of blood in the stool  Significant tenderness or worsening of abdominal pains  Swelling of the abdomen that is new, acute  Fever of 100F or higher  Following upper endoscopy (EGD)  Vomiting of blood or coffee ground material  New chest pain or pain under the shoulder blades  Painful or persistently difficult swallowing  New shortness of breath  Fever of 100F or higher  Black, tarry-looking stools  FOLLOW UP: If any biopsies were taken you will be contacted by phone or by letter within the next 1-3 weeks.  Call your gastroenterologist if you have not heard about the biopsies in 3 weeks.  Our staff will call the home number listed on your records the next business day following your procedure to check on you and address any questions or concerns that you may have at that time regarding the information given to you following your procedure. This is a courtesy call and so if there is no answer at the home number and we have not heard from you through the emergency physician on call, we will assume that you have returned  to your regular daily activities without incident.  SIGNATURES/CONFIDENTIALITY: You and/or your care partner have signed paperwork which will be entered into your electronic medical record.  These signatures attest to the fact that that the information above on your After Visit Summary has been reviewed and is understood.  Full responsibility of the confidentiality of this discharge information lies with you and/or your care-partner.  

## 2011-08-03 NOTE — Op Note (Signed)
[name] [address] [name], [place]  [address]  ENDOSCOPY PROCEDURE REPORT  PATIENT:  Taylor, Yates  MR#:  [id number] BIRTHDATE:  [birth year], 31 yrs. old  GENDER:  female  ENDOSCOPIST:  Vania Rea. Jarold Motto, MD, [facility] Referred by:  PROCEDURE DATE:  08/03/2011 PROCEDURE:  EGD with biopsy, [id number] ASA CLASS:  Class II INDICATIONS:  ABD PAIN S/P CHOLECYSTECTOMY  MEDICATIONS:   There was residual sedation effect present from prior procedure., propofol (Diprivan) 100 mg IV TOPICAL ANESTHETIC:  DESCRIPTION OF PROCEDURE:   After the risks and benefits of the procedure were explained, informed consent was obtained.  The LB GIF-H180 [id number] endoscope was introduced through the mouth and advanced to the second portion of the duodenum.  The instrument was slowly withdrawn as the mucosa was fully examined. <<PROCEDUREIMAGES>>  The upper, middle, and distal third of the esophagus were carefully inspected and no abnormalities were noted. The z-line was well seen at the GEJ. The endoscope was pushed into the fundus which was normal including a retroflexed view. The antrum,gastric body, first and second part of the duodenum were unremarkable. DUODENAL BIOPSIES AND CLO BX. DONE.    Retroflexed views revealed no abnormalities.    The scope was then withdrawn from the patient and the procedure completed.  COMPLICATIONS:  None  ENDOSCOPIC IMPRESSION: 1) Normal EGD PROBABLE IBS,R/O CELIAC DISEASE,H.PYLORI RECOMMENDATIONS: 1) Await biopsy results 2) Rx CLO if positive 3) continue current medications  ______________________________ Vania Rea. Jarold Motto, MD, [name]  CC:  Thomos Lemons, DO, Avel Peace, MD  n. [name]:   [name] at 08/03/2011 10:50 AM  Helmut Muster, [phone]

## 2011-08-04 ENCOUNTER — Telehealth: Payer: Self-pay | Admitting: *Deleted

## 2011-08-04 LAB — HELICOBACTER PYLORI SCREEN-BIOPSY: UREASE: NEGATIVE

## 2011-08-04 NOTE — Telephone Encounter (Signed)
  Follow up Call-  Call back number 08/03/2011  Post procedure Call Back phone  # [phone]  Permission to leave phone message Yes     Patient questions:  Do you have a fever, pain , or abdominal swelling? no Pain Score  0 *  Have you tolerated food without any problems? yes  Have you been able to return to your normal activities? yes  Do you have any questions about your discharge instructions: Diet   no Medications  no Follow up visit  no  Do you have questions or concerns about your Care? no  Actions: * If pain score is 4 or above: No action needed, pain <4.

## 2011-08-09 ENCOUNTER — Encounter: Payer: Self-pay | Admitting: Gastroenterology

## 2011-08-23 ENCOUNTER — Encounter: Payer: Self-pay | Admitting: *Deleted

## 2011-08-23 ENCOUNTER — Ambulatory Visit (INDEPENDENT_AMBULATORY_CARE_PROVIDER_SITE_OTHER): Payer: BC Managed Care – PPO | Admitting: Gastroenterology

## 2011-08-23 VITALS — BP 92/68 | HR 80 | Ht 64.0 in | Wt 204.1 lb

## 2011-08-23 DIAGNOSIS — R0789 Other chest pain: Secondary | ICD-10-CM

## 2011-08-23 DIAGNOSIS — Z9049 Acquired absence of other specified parts of digestive tract: Secondary | ICD-10-CM

## 2011-08-23 DIAGNOSIS — Z9089 Acquired absence of other organs: Secondary | ICD-10-CM

## 2011-08-23 DIAGNOSIS — K589 Irritable bowel syndrome without diarrhea: Secondary | ICD-10-CM

## 2011-08-23 MED ORDER — HYOSCYAMINE SULFATE 0.125 MG SL SUBL: 0.1250 mg | SUBLINGUAL_TABLET | SUBLINGUAL | Status: DC | PRN

## 2011-08-23 NOTE — Progress Notes (Signed)
This is a 31 year old Caucasian female with chronic IBS. She underwent cholecystectomy one year ago his had worsening of her IBS since that time. She was scheduled as directed endoscopy and colonoscopy several weeks ago, and these exams were unremarkable including small bowel biopsy and colon biopsies. At that time, we felt that she had post cholecystectomy exacerbation of her IBS, prescribed WelChol daily, but she did not take this medication. She continues with gas, bloating, crampy abdominal pain, and continued burning substernal chest pain despite Dexilant 60 mg a day for over a year. Despite these complaints she's had no anorexia, weight loss, melena or hematochezia or any specific hepatobiliary complaints. She is accompanied today by her mother during the exam and interview. We also have tried a FODMAP IBS diet which she has not used.  Current Medications, Allergies, Past Medical History, Past Surgical History, Family History and Social History were reviewed in [employer] record.  Pertinent Review of Systems Negative   Physical Exam: Very flat affect with repeated denial and  Indifference to questioning and interview. Her blood pressure is 90/68, pulse 80 and regular, she weighs 204 pounds a BMI of 35.04. Examination oral pharyngeal area is unremarkable. Abdominal exam shows no organomegaly, masses, or tenderness. Bowel sounds are normal. I cannot appreciate stigmata of chronic liver disease.    Assessment and Plan: IBS exacerbated by cholecystectomy. This patient extremely difficult to treat because of her questioning of almost all diagnoses in therapy. I have urged her to continue all her suggested diet with daily Metamucil. Her continued burning substernal pain is most likely functional in nature, but I have ordered 24-hour pH probe testing and esophageal manometry for better clarification this issue and to exclude underlying GI motility disorder. I also prescribed when  necessary sublingual Levsin, and discussed IBS and is management in length with the patient and her mother. Encounter Diagnosis  Name Primary?  . Chest pain, non-cardiac Yes

## 2011-08-23 NOTE — Patient Instructions (Addendum)
Your Esophageal Manometry Study/PH PROBE is scheduled at [facility] Endoscopy department on 09/19/2011 at 9am, Please arrive 20 minutes early Nothing to eat or drink after midnight You will need to be off your stomach medications 24 hours in advance We are giving you Dexilant samples We have sent in a prescription to your pharmacy Try Metamucil daily

## 2011-08-29 ENCOUNTER — Encounter: Payer: Self-pay | Admitting: Internal Medicine

## 2011-08-29 ENCOUNTER — Ambulatory Visit (INDEPENDENT_AMBULATORY_CARE_PROVIDER_SITE_OTHER): Payer: BC Managed Care – PPO | Admitting: Internal Medicine

## 2011-08-29 VITALS — BP 106/78 | Temp 98.9°F | Wt 203.0 lb

## 2011-08-29 DIAGNOSIS — L309 Dermatitis, unspecified: Secondary | ICD-10-CM | POA: Insufficient documentation

## 2011-08-29 DIAGNOSIS — L259 Unspecified contact dermatitis, unspecified cause: Secondary | ICD-10-CM

## 2011-08-29 DIAGNOSIS — R21 Rash and other nonspecific skin eruption: Secondary | ICD-10-CM

## 2011-08-29 NOTE — Assessment & Plan Note (Signed)
31 year old female with papular rash over bilateral axilla, waist and groin. I doubt her this rashes reaction to oral agent or contact dermatitis. Consider scabies. Refer to dermatologist for further evaluation and treatment.

## 2011-08-29 NOTE — Patient Instructions (Addendum)
Use zyrtec and ranitidine as directed. Our office will contact you re: dermatology referral
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
## 2011-09-19 ENCOUNTER — Encounter (HOSPITAL_COMMUNITY)
Admission: RE | Disposition: A | Discharge status: Home / Self Care | Payer: Self-pay | Source: Ambulatory Visit | Attending: Gastroenterology

## 2011-09-19 ENCOUNTER — Encounter (HOSPITAL_BASED_OUTPATIENT_CLINIC_OR_DEPARTMENT_OTHER): Payer: BC Managed Care – PPO | Admitting: Gastroenterology

## 2011-09-19 ENCOUNTER — Ambulatory Visit (HOSPITAL_COMMUNITY)
Admission: RE | Admit: 2011-09-19 | Discharge: 2011-09-19 | Disposition: A | Discharge status: Home / Self Care | Payer: BC Managed Care – PPO | Source: Ambulatory Visit | Attending: Gastroenterology | Admitting: Gastroenterology

## 2011-09-19 ENCOUNTER — Telehealth: Payer: Self-pay | Admitting: Internal Medicine

## 2011-09-19 DIAGNOSIS — R1310 Dysphagia, unspecified: Secondary | ICD-10-CM | POA: Insufficient documentation

## 2011-09-19 DIAGNOSIS — K219 Gastro-esophageal reflux disease without esophagitis: Secondary | ICD-10-CM | POA: Insufficient documentation

## 2011-09-19 HISTORY — PX: 24 HOUR PH STUDY: SHX5419

## 2011-09-19 HISTORY — PX: ESOPHAGEAL MANOMETRY: SHX5429

## 2011-09-19 SURGERY — MANOMETRY, ESOPHAGUS

## 2011-09-19 MED ORDER — LIDOCAINE VISCOUS 2 % MT SOLN
OROMUCOSAL | Status: AC
  Filled 2011-09-19: qty 15

## 2011-09-19 NOTE — OR Nursing (Signed)
After 24 hr pH study probe placed into esophagus at 31.5 cm, pt had some slight coughing.  Able to tolerate po liquids and food.  After eating, coughing subsided and pt stated that she was able to tolerate probe better now.  Angelique Blonder, RN

## 2011-09-19 NOTE — Telephone Encounter (Signed)
24 hr pH probe placed today Pt called to say some coughing, but not intractable Is worried tube is kinked, but is able to eat/drink and swallow.  Throat is a little sore. No chest pain or dyspnea. Will try to complete the study and call back if worse.

## 2011-09-20 NOTE — Telephone Encounter (Signed)
Called pt to check on her check on her. lmom for her to call back. Spoke with RN Judeth Cornfield at [facility] who stated pt came in this am to have the probe removed.

## 2011-09-21 ENCOUNTER — Encounter (HOSPITAL_COMMUNITY): Payer: Self-pay | Admitting: Gastroenterology

## 2011-10-26 ENCOUNTER — Telehealth: Payer: Self-pay | Admitting: Gastroenterology

## 2011-10-26 NOTE — Telephone Encounter (Signed)
Cannot find any documentation in the system. Will check with Dr Jarold Motto. Informed pt's mom.

## 2011-11-11 ENCOUNTER — Telehealth: Payer: Self-pay | Admitting: *Deleted

## 2011-11-11 ENCOUNTER — Encounter: Payer: Self-pay | Admitting: *Deleted

## 2011-11-11 NOTE — Telephone Encounter (Signed)
Pt's mom called to request results of pt's Esophageal Manometry. Informed her Dr Jarold Motto needs to review the test; I will call back. Dr Jarold Motto, I have place the report in you IN BASKET, please advise. Thanks.

## 2011-11-16 NOTE — Telephone Encounter (Signed)
Please see report dictated 11-16-11

## 2011-11-16 NOTE — Progress Notes (Signed)
Esophageal manometry:  #1 upper esophageal sphincter-there is normal coordination between pharyngeal contraction and cricopharyngeal relaxation.  #2 lower esophageal sphincter-mean amplitude of LES pressure is 22 mm of mercury with 50% relaxation with swallowing.  #3 motility pattern-their normally propagated peristaltic waves throughout the esophagus to both wet and dry swallows. Mean amplitude of contraction is 74 mm of mercury.  Assessment:: this is a normal soft tissue manometry without any evidence of an esophageal motility disturbance.  24-hour pH probe analysis: A dual probe analysis was completed without difficulty. There is no evidence of acid reflux in the upright or supine position. However, distal esophageal DeMeester score is 25.8 normal less than 22. There is some recumbent reflux noted with the longest episode of 15.5 minutes. There is no proximal acid reflux whatsoever.  Assessment: Evidence of minimal acid reflux in the recumbent position. This should respond adequate acid suppression with PPI therapy.

## 2011-11-21 ENCOUNTER — Telehealth: Payer: Self-pay | Admitting: *Deleted

## 2011-11-21 NOTE — Telephone Encounter (Signed)
lmom for mom, Taylor Yates that I am mailing results of her EM and 24hr PH Probe Study to her; thanked them for their patience.

## 2011-11-21 NOTE — Telephone Encounter (Signed)
Pt's mom called back after I left the message that I had mailed the results to her. She reports the Dexilant that works better than most, but [employer] will not pay for it unless she has failed Prilosec and Nexium; mom states she has failed both of these. She reports pt received a letter stating they may make a Level 1 appeal and fax it to [phone] for claim # [id number], denial code 3545, [address] [address], [place], [place] [address]. Wrote a Physicist, medical to the Optometrist.

## 2011-11-29 ENCOUNTER — Telehealth: Payer: Self-pay | Admitting: Gastroenterology

## 2011-11-29 MED ORDER — DEXLANSOPRAZOLE 60 MG PO CPDR: DELAYED_RELEASE_CAPSULE | ORAL | Status: DC

## 2011-11-29 NOTE — Telephone Encounter (Signed)
Pt's mom reports they haven't heard from [facility] about the appeal for Dexilant; could we leave her some samples? Informed mom I left samples at the front desk.

## 2011-12-02 ENCOUNTER — Telehealth: Payer: Self-pay | Admitting: Gastroenterology

## 2011-12-02 MED ORDER — DEXLANSOPRAZOLE 60 MG PO CPDR: DELAYED_RELEASE_CAPSULE | ORAL | Status: DC

## 2011-12-02 NOTE — Telephone Encounter (Signed)
Pt's mom called to report they got the approval for Dexilant; please order. Ordered 90 day supply, but wrote in pharmacist not to fill for 30 caps if 90 is not approved for.

## 2011-12-05 ENCOUNTER — Telehealth: Payer: Self-pay | Admitting: *Deleted

## 2011-12-05 MED ORDER — DEXLANSOPRAZOLE 60 MG PO CPDR: DELAYED_RELEASE_CAPSULE | ORAL | Status: DC

## 2011-12-05 NOTE — Telephone Encounter (Signed)
Resent the [employer], but Taylor Yates stated they did not receive it, so I gave a verbal.

## 2012-06-28 ENCOUNTER — Other Ambulatory Visit: Payer: Self-pay | Admitting: *Deleted

## 2012-06-28 MED ORDER — DEXLANSOPRAZOLE 60 MG PO CPDR: DELAYED_RELEASE_CAPSULE | ORAL | Status: DC

## 2012-06-28 NOTE — Telephone Encounter (Signed)
I called [facility] at [phone] I spoke with Kevin Fenton and per Kevin Fenton patient is approved for Dexilant. Sent refill for Dexilant. Case Number: [id number]

## 2012-09-07 ENCOUNTER — Ambulatory Visit (INDEPENDENT_AMBULATORY_CARE_PROVIDER_SITE_OTHER): Payer: BC Managed Care – PPO | Admitting: Internal Medicine

## 2012-09-07 ENCOUNTER — Encounter: Payer: Self-pay | Admitting: Internal Medicine

## 2012-09-07 VITALS — BP 114/74 | HR 76 | Temp 98.1°F | Wt 210.0 lb

## 2012-09-07 DIAGNOSIS — M25519 Pain in unspecified shoulder: Secondary | ICD-10-CM

## 2012-09-07 DIAGNOSIS — M25512 Pain in left shoulder: Secondary | ICD-10-CM

## 2012-09-07 MED ORDER — METHOCARBAMOL 500 MG PO TABS: 500.0000 mg | ORAL_TABLET | Freq: Three times a day (TID) | ORAL | Status: DC | PRN
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
## 2012-09-07 NOTE — Patient Instructions (Addendum)
Please contact our office if your symptoms do not improve or gets worse.  

## 2012-09-07 NOTE — Assessment & Plan Note (Signed)
32 year old white female with intermittent pain near base of neck (left side and trapezius area). I suspect symptoms secondary to muscle strain. Trial of physical therapy and muscle relaxers. Avoid NSAIDs due to history of gastritis. Patient advised to call office if symptoms persist or worsen.

## 2012-09-13 ENCOUNTER — Ambulatory Visit: Payer: BC Managed Care – PPO | Attending: Internal Medicine

## 2012-09-13 DIAGNOSIS — M25519 Pain in unspecified shoulder: Secondary | ICD-10-CM | POA: Insufficient documentation

## 2012-09-13 DIAGNOSIS — IMO0001 Reserved for inherently not codable concepts without codable children: Secondary | ICD-10-CM | POA: Insufficient documentation

## 2012-09-13 DIAGNOSIS — M542 Cervicalgia: Secondary | ICD-10-CM | POA: Insufficient documentation

## 2012-09-13 DIAGNOSIS — R5381 Other malaise: Secondary | ICD-10-CM | POA: Insufficient documentation

## 2012-09-17 ENCOUNTER — Ambulatory Visit: Payer: BC Managed Care – PPO | Admitting: Physical Therapy

## 2012-09-19 ENCOUNTER — Ambulatory Visit: Payer: BC Managed Care – PPO | Admitting: Physical Therapy

## 2012-09-24 ENCOUNTER — Ambulatory Visit: Payer: BC Managed Care – PPO | Admitting: Physical Therapy

## 2012-09-26 ENCOUNTER — Ambulatory Visit: Payer: BC Managed Care – PPO

## 2012-10-01 ENCOUNTER — Ambulatory Visit: Payer: BC Managed Care – PPO | Admitting: Physical Therapy

## 2012-10-03 ENCOUNTER — Ambulatory Visit: Payer: BC Managed Care – PPO

## 2012-10-04 ENCOUNTER — Encounter: Payer: Self-pay | Admitting: Internal Medicine

## 2012-10-04 ENCOUNTER — Ambulatory Visit (INDEPENDENT_AMBULATORY_CARE_PROVIDER_SITE_OTHER): Payer: BC Managed Care – PPO | Admitting: Internal Medicine

## 2012-10-04 ENCOUNTER — Telehealth: Payer: Self-pay | Admitting: Internal Medicine

## 2012-10-04 VITALS — BP 114/74 | Temp 98.5°F | Wt 216.0 lb

## 2012-10-04 DIAGNOSIS — M25512 Pain in left shoulder: Secondary | ICD-10-CM

## 2012-10-04 DIAGNOSIS — M25519 Pain in unspecified shoulder: Secondary | ICD-10-CM

## 2012-10-04 DIAGNOSIS — M542 Cervicalgia: Secondary | ICD-10-CM

## 2012-10-04 DIAGNOSIS — G8929 Other chronic pain: Secondary | ICD-10-CM

## 2012-10-04 MED ORDER — NAPROXEN SODIUM 220 MG PO CAPS: ORAL_CAPSULE | ORAL | Status: DC

## 2012-10-04 NOTE — Telephone Encounter (Signed)
appt scheduled today 

## 2012-10-04 NOTE — Telephone Encounter (Signed)
PT states that the PT therapy did help her pain at that time, but the next day, the pain returned as it had been. She states that the PT had no lasting effects. She would like to see you soon, but the next available is 2 week away. Please assist.
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
## 2012-10-04 NOTE — Assessment & Plan Note (Signed)
Patient with persistent symptoms despite physical therapy/conservative measures. She has radiation of pain from her left neck to left upper shoulder. Rule out cervical radiculopathy. Obtain MRI of C-spine without contrast. Use naproxen short-term while taking her proton pump inhibitor.  Reassess in 1 month.

## 2012-11-23 ENCOUNTER — Telehealth: Payer: Self-pay | Admitting: Internal Medicine

## 2012-11-23 NOTE — Telephone Encounter (Signed)
Pt would like yo to call her concerning work medical leave.  Pt has paperwork and will bring by Monday.

## 2012-11-27 NOTE — Telephone Encounter (Signed)
Pt wanted to discuss a form with Dr Artist Pais for medical leave.  Pt did not want to wait for Dr Artist Pais to come back next week.  Appt scheduled with padonda

## 2012-11-28 ENCOUNTER — Ambulatory Visit
Admission: RE | Admit: 2012-11-28 | Discharge: 2012-11-28 | Disposition: A | Discharge status: Home / Self Care | Payer: BC Managed Care – PPO | Source: Ambulatory Visit | Attending: Internal Medicine | Admitting: Internal Medicine

## 2012-11-28 DIAGNOSIS — G8929 Other chronic pain: Secondary | ICD-10-CM

## 2012-11-29 NOTE — Progress Notes (Signed)
Quick Note:  Left a message for return call. ______ 

## 2012-11-30 ENCOUNTER — Encounter: Payer: Self-pay | Admitting: Family

## 2012-11-30 ENCOUNTER — Ambulatory Visit (INDEPENDENT_AMBULATORY_CARE_PROVIDER_SITE_OTHER): Payer: BC Managed Care – PPO | Admitting: Family

## 2012-11-30 VITALS — BP 112/72 | HR 72 | Wt 218.0 lb

## 2012-11-30 DIAGNOSIS — F411 Generalized anxiety disorder: Secondary | ICD-10-CM

## 2012-11-30 DIAGNOSIS — G8929 Other chronic pain: Secondary | ICD-10-CM

## 2012-11-30 DIAGNOSIS — M542 Cervicalgia: Secondary | ICD-10-CM
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
## 2012-12-05 NOTE — Progress Notes (Signed)
Quick Note:  Called and spoke with pt and pt is aware. ______ 

## 2012-12-21 ENCOUNTER — Telehealth: Payer: Self-pay | Admitting: Internal Medicine

## 2012-12-21 NOTE — Telephone Encounter (Signed)
Pt is requesting a work note. Pt was out of 12-10-12 until 03-04-13. Pt need the work note to attend a seminars. Pt would like work note to be mail to home address.

## 2012-12-21 NOTE — Telephone Encounter (Signed)
Pt will return back to work on 03-05-13

## 2012-12-21 NOTE — Telephone Encounter (Signed)
Please verify the return date. May not be able to provide note. Will have to discuss with [facility]

## 2012-12-28 ENCOUNTER — Ambulatory Visit (INDEPENDENT_AMBULATORY_CARE_PROVIDER_SITE_OTHER): Payer: BC Managed Care – PPO | Admitting: Family

## 2012-12-28 ENCOUNTER — Encounter: Payer: Self-pay | Admitting: Family

## 2012-12-28 VITALS — BP 98/62 | HR 93 | Wt 218.0 lb

## 2012-12-28 DIAGNOSIS — F3289 Other specified depressive episodes: Secondary | ICD-10-CM

## 2012-12-28 DIAGNOSIS — J309 Allergic rhinitis, unspecified: Secondary | ICD-10-CM

## 2012-12-28 DIAGNOSIS — F411 Generalized anxiety disorder: Secondary | ICD-10-CM

## 2012-12-28 DIAGNOSIS — F32A Depression, unspecified: Secondary | ICD-10-CM

## 2012-12-28 DIAGNOSIS — F329 Major depressive disorder, single episode, unspecified: Secondary | ICD-10-CM

## 2012-12-28 MED ORDER — BUPROPION HCL ER (XL) 150 MG PO TB24: 150.0000 mg | ORAL_TABLET | Freq: Every day | ORAL | Status: DC
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
## 2012-12-28 NOTE — Patient Instructions (Signed)

## 2012-12-31 LAB — ~~LOC~~ ALLERGY PANEL
Allergen, Cedar tree, t12: <0.1 kU/L
Allergen, D pternoyssinus,d7: <0.1 kU/L
Allergen, Mulberry, t76: <0.1 kU/L
Aspergillus fumigatus, m3: <0.1 kU/L
Bahia Grass: <0.1 kU/L
Cladosporium Herbarum: <0.1 kU/L
Cockroach: <0.1 kU/L
Dog Dander: <0.1 kU/L
Elm IgE: <0.1 kU/L
Johnson Grass: <0.1 kU/L
Nettle: <0.1 kU/L
Oak: <0.1 kU/L
Pecan/Hickory Tree IgE: <0.1 kU/L
Penicillium Notatum: <0.1 kU/L
Plantain: <0.1 kU/L
Stemphylium Botryosum: <0.1 kU/L
Sweet Gum: <0.1 kU/L
Timothy Grass: <0.1 kU/L

## 2013-01-24 ENCOUNTER — Encounter: Payer: Self-pay | Admitting: Family

## 2013-01-24 ENCOUNTER — Ambulatory Visit (INDEPENDENT_AMBULATORY_CARE_PROVIDER_SITE_OTHER): Payer: BC Managed Care – PPO | Admitting: Family

## 2013-01-24 VITALS — BP 100/62 | HR 95 | Wt 216.0 lb

## 2013-01-24 DIAGNOSIS — E039 Hypothyroidism, unspecified: Secondary | ICD-10-CM

## 2013-01-24 DIAGNOSIS — F411 Generalized anxiety disorder: Secondary | ICD-10-CM

## 2013-01-24 NOTE — Progress Notes (Signed)
Pre visit review using our clinic review tool, if applicable. No additional management support is needed unless otherwise documented below in the visit note. 

## 2013-01-24 NOTE — Patient Instructions (Signed)
Exercise to Stay Healthy Exercise helps you become and stay healthy. EXERCISE IDEAS AND TIPS Choose exercises that:  You enjoy.  Fit into your day. You do not need to exercise really hard to be healthy. You can do exercises at a slow or medium level and stay healthy. You can:  Stretch before and after working out.  Try yoga, Pilates, or tai chi.  Lift weights.  Walk fast, swim, jog, run, climb stairs, bicycle, dance, or rollerskate.  Take aerobic classes. Exercises that burn about 150 calories:  Running 1  miles in 15 minutes.  Playing volleyball for 45 to 60 minutes.  Washing and waxing a car for 45 to 60 minutes.  Playing touch football for 45 minutes.  Walking 1  miles in 35 minutes.  Pushing a stroller 1  miles in 30 minutes.  Playing basketball for 30 minutes.  Raking leaves for 30 minutes.  Bicycling 5 miles in 30 minutes.  Walking 2 miles in 30 minutes.  Dancing for 30 minutes.  Shoveling snow for 15 minutes.  Swimming laps for 20 minutes.  Walking up stairs for 15 minutes.  Bicycling 4 miles in 15 minutes.  Gardening for 30 to 45 minutes.  Jumping rope for 15 minutes.  Washing windows or floors for 45 to 60 minutes. Document Released: 02/26/2010 Document Revised: 04/18/2011 Document Reviewed: 02/26/2010 [employer] Patient Information 2014 [employer].  

## 2013-01-25 ENCOUNTER — Encounter: Payer: Self-pay | Admitting: Family
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
## 2013-03-06 ENCOUNTER — Ambulatory Visit (INDEPENDENT_AMBULATORY_CARE_PROVIDER_SITE_OTHER): Payer: BC Managed Care – PPO | Admitting: Family

## 2013-03-06 ENCOUNTER — Encounter: Payer: Self-pay | Admitting: Family

## 2013-03-06 VITALS — BP 100/62 | HR 97 | Wt 216.0 lb

## 2013-03-06 DIAGNOSIS — F411 Generalized anxiety disorder: Secondary | ICD-10-CM

## 2013-03-06 NOTE — Patient Instructions (Addendum)
Insomnia Insomnia is frequent trouble falling and/or staying asleep. Insomnia can be a long term problem or a short term problem. Both are common. Insomnia can be a short term problem when the wakefulness is related to a certain stress or worry. Long term insomnia is often related to ongoing stress during waking hours and/or poor sleeping habits. Overtime, sleep deprivation itself can make the problem worse. Every little thing feels more severe because you are overtired and your ability to cope is decreased. CAUSES   Stress, anxiety, and depression.  Poor sleeping habits.  Distractions such as TV in the bedroom.  Naps close to bedtime.  Engaging in emotionally charged conversations before bed.  Technical reading before sleep.  Alcohol and other sedatives. They may make the problem worse. They can hurt normal sleep patterns and normal dream activity.  Stimulants such as caffeine for several hours prior to bedtime.  Pain syndromes and shortness of breath can cause insomnia.  Exercise late at night.  Changing time zones may cause sleeping problems (jet lag). It is sometimes helpful to have someone observe your sleeping patterns. They should look for periods of not breathing during the night (sleep apnea). They should also look to see how long those periods last. If you live alone or observers are uncertain, you can also be observed at a sleep clinic where your sleep patterns will be professionally monitored. Sleep apnea requires a checkup and treatment. Give your caregivers your medical history. Give your caregivers observations your family has made about your sleep.  SYMPTOMS   Not feeling rested in the morning.  Anxiety and restlessness at bedtime.  Difficulty falling and staying asleep. TREATMENT   Your caregiver may prescribe treatment for an underlying medical disorders. Your caregiver can give advice or help if you are using alcohol or other drugs for self-medication. Treatment  of underlying problems will usually eliminate insomnia problems.  Medications can be prescribed for short time use. They are generally not recommended for lengthy use.  Over-the-counter sleep medicines are not recommended for lengthy use. They can be habit forming.  You can promote easier sleeping by making lifestyle changes such as:  Using relaxation techniques that help with breathing and reduce muscle tension.  Exercising earlier in the day.  Changing your diet and the time of your last meal. No night time snacks.  Establish a regular time to go to bed.  Counseling can help with stressful problems and worry.  Soothing music and white noise may be helpful if there are background noises you cannot remove.  Stop tedious detailed work at least one hour before bedtime. HOME CARE INSTRUCTIONS   Keep a diary. Inform your caregiver about your progress. This includes any medication side effects. See your caregiver regularly. Take note of:  Times when you are asleep.  Times when you are awake during the night.  The quality of your sleep.  How you feel the next day. This information will help your caregiver care for you.  Get out of bed if you are still awake after 15 minutes. Read or do some quiet activity. Keep the lights down. Wait until you feel sleepy and go back to bed.  Keep regular sleeping and waking hours. Avoid naps.  Exercise regularly.  Avoid distractions at bedtime. Distractions include watching television or engaging in any intense or detailed activity like attempting to balance the household checkbook.  Develop a bedtime ritual. Keep a familiar routine of bathing, brushing your teeth, climbing into bed at the same   time each night, listening to soothing music. Routines increase the success of falling to sleep faster.  Use relaxation techniques. This can be using breathing and muscle tension release routines. It can also include visualizing peaceful scenes. You can  also help control troubling or intruding thoughts by keeping your mind occupied with boring or repetitive thoughts like the old concept of counting sheep. You can make it more creative like imagining planting one beautiful flower after another in your backyard garden.  During your day, work to eliminate stress. When this is not possible use some of the previous suggestions to help reduce the anxiety that accompanies stressful situations. MAKE SURE YOU:   Understand these instructions.  Will watch your condition.  Will get help right away if you are not doing well or get worse. Document Released: 01/22/2000 Document Revised: 04/18/2011 Document Reviewed: 02/21/2007 [facility] Patient Information 2014 [place].  Exercise to Lose Weight Exercise and a healthy diet may help you lose weight. Your doctor may suggest specific exercises. EXERCISE IDEAS AND TIPS  Choose low-cost things you enjoy doing, such as walking, bicycling, or exercising to workout videos.  Take stairs instead of the elevator.  Walk during your lunch break.  Park your car further away from work or school.  Go to a gym or an exercise class.  Start with 5 to 10 minutes of exercise each day. Build up to 30 minutes of exercise 4 to 6 days a week.  Wear shoes with good support and comfortable clothes.  Stretch before and after working out.  Work out until you breathe harder and your heart beats faster.  Drink extra water when you exercise.  Do not do so much that you hurt yourself, feel dizzy, or get very short of breath. Exercises that burn about 150 calories:  Running 1  miles in 15 minutes.  Playing volleyball for 45 to 60 minutes.  Washing and waxing a car for 45 to 60 minutes.  Playing touch football for 45 minutes.  Walking 1  miles in 35 minutes.  Pushing a stroller 1  miles in 30 minutes.  Playing basketball for 30 minutes.  Raking leaves for 30 minutes.  Bicycling 5 miles in 30  minutes.  Walking 2 miles in 30 minutes.  Dancing for 30 minutes.  Shoveling snow for 15 minutes.  Swimming laps for 20 minutes.  Walking up stairs for 15 minutes.  Bicycling 4 miles in 15 minutes.  Gardening for 30 to 45 minutes.  Jumping rope for 15 minutes.  Washing windows or floors for 45 to 60 minutes. Document Released: 02/26/2010 Document Revised: 04/18/2011 Document Reviewed: 02/26/2010 [facility] Patient Information 2014 [place], [place].
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
## 2013-03-06 NOTE — Progress Notes (Signed)
Pre visit review using our clinic review tool, if applicable. No additional management support is needed unless otherwise documented below in the visit note. 

## 2014-02-19 ENCOUNTER — Telehealth: Payer: Self-pay | Admitting: Gastroenterology

## 2014-02-20 ENCOUNTER — Encounter: Payer: Self-pay | Admitting: Gastroenterology

## 2014-02-20 ENCOUNTER — Ambulatory Visit (INDEPENDENT_AMBULATORY_CARE_PROVIDER_SITE_OTHER): Payer: BC Managed Care – PPO | Admitting: Gastroenterology

## 2014-02-20 VITALS — BP 110/76 | HR 64 | Ht 64.0 in | Wt 216.6 lb

## 2014-02-20 DIAGNOSIS — K219 Gastro-esophageal reflux disease without esophagitis: Secondary | ICD-10-CM

## 2014-02-20 DIAGNOSIS — K589 Irritable bowel syndrome without diarrhea: Secondary | ICD-10-CM

## 2014-02-20 MED ORDER — RANITIDINE HCL 150 MG PO TABS: 150.0000 mg | ORAL_TABLET | Freq: Two times a day (BID) | ORAL | Status: DC

## 2014-02-20 NOTE — Telephone Encounter (Signed)
Spoke with patient and scheduled her with Alonza Bogus, PA today at 2:30 PM to establish with new GI since she has not been seen in 3 years.

## 2014-02-20 NOTE — Patient Instructions (Signed)
We have sent the following medications to your pharmacy for you to pick up at your convenience:  Zantac.  please follow up as needed.

## 2014-02-20 NOTE — Progress Notes (Addendum)
     02/20/2014 Taylor Yates [id number] October 14, 1980   History of Present Illness:  This is a pleasant 34 year old female who is previously known to Dr. Sharlett Iles for IBS and GERD.  She is here today to establish care with a new GI physician.  She says that as far as her IBS goes, it has been doing well overall.  She gets occasional flares, but they don't last long and she is not taking any medication for her symptoms.  She says that she does not really like taking medication.  In fact, she stopped her Dexilant for her GERD almost a year ago because she did not feel like it was really making much of a difference in her symptoms; she was still getting reflux while on the medication.  She says that she gets reflux once or twice a week and has just been using Tums prn.  She needs a note for school in order to have privileges to use the bathroom if she has flares of her IBS.  Current Medications, Allergies, Past Medical History, Past Surgical History, Family History and Social History were reviewed in [employer] record.   Physical Exam: BP 110/76 mmHg  Pulse 64  Ht 5\' 4"  (1.626 m)  Wt 216 lb 9.6 oz (98.249 kg)  BMI 37.16 kg/m2  LMP 02/20/2014 General: Well developed white female in no acute distress Head: Normocephalic and atraumatic Eyes:  Sclerae anicteric, conjunctiva pink  Ears: Normal auditory acuity Lungs: Clear throughout to auscultation Heart: Regular rate and rhythm Abdomen: Soft, non-distended.  Normal bowel sounds.  Non-tender. Musculoskeletal: Symmetrical with no gross deformities  Extremities: No edema  Neurological: Alert oriented x 4, grossly non-focal Psychological:  Alert and cooperative. Normal mood and affect  Assessment and Recommendations: -GERD:  Will just try Zantac 150 mg daily.  Dexilant did not seem to make a difference and she doesn't think Nexium agreed with her well previously.  Does not really like to take medication, but willing  to try something different.   -IBS:  Intermittent flares, but overall controlled at this time.  Does not take any medication for this currently.  Will give her a note for her class at [facility] in order for her to use the restroom if needed.  Addendum: Reviewed and agree with initial management. Jerene Bears, MD

## 2014-08-13 ENCOUNTER — Telehealth: Payer: Self-pay | Admitting: Internal Medicine

## 2014-08-13 NOTE — Telephone Encounter (Signed)
Pt call to ask for a titers test for pacific MMR,VARICELLA,HEPT B ,  °

## 2014-08-14 ENCOUNTER — Telehealth: Payer: Self-pay | Admitting: Internal Medicine

## 2014-08-14 NOTE — Telephone Encounter (Signed)
Please advise 

## 2014-08-14 NOTE — Telephone Encounter (Addendum)
Pt is going to [facility] radiology program. Pt needs titers for MMR, chicken pox and hep B. Pt would like quantifuron test for tb and tetanus inj. Can I sch? Pt needs titers by 08-29-14

## 2014-08-14 NOTE — Telephone Encounter (Signed)
Is this okay to schedule?

## 2014-08-15 NOTE — Telephone Encounter (Signed)
Ok to schedule.

## 2014-08-18 ENCOUNTER — Other Ambulatory Visit: Payer: BC Managed Care – PPO

## 2014-08-18 NOTE — Telephone Encounter (Signed)
Pt scheduled on Wed for Injection and Labs

## 2014-08-20 ENCOUNTER — Other Ambulatory Visit (INDEPENDENT_AMBULATORY_CARE_PROVIDER_SITE_OTHER): Payer: BLUE CROSS/BLUE SHIELD

## 2014-08-20 ENCOUNTER — Other Ambulatory Visit: Payer: Self-pay | Admitting: *Deleted

## 2014-08-20 ENCOUNTER — Ambulatory Visit (INDEPENDENT_AMBULATORY_CARE_PROVIDER_SITE_OTHER): Payer: BLUE CROSS/BLUE SHIELD | Admitting: Family Medicine

## 2014-08-20 DIAGNOSIS — Z9189 Other specified personal risk factors, not elsewhere classified: Secondary | ICD-10-CM

## 2014-08-20 DIAGNOSIS — Z2839 Other underimmunization status: Secondary | ICD-10-CM

## 2014-08-20 DIAGNOSIS — Z1159 Encounter for screening for other viral diseases: Secondary | ICD-10-CM | POA: Diagnosis not present

## 2014-08-20 DIAGNOSIS — Z23 Encounter for immunization: Secondary | ICD-10-CM | POA: Diagnosis not present

## 2014-08-21 LAB — HEPATITIS B SURFACE ANTIBODY,QUALITATIVE: Hep B S Ab: POSITIVE — AB

## 2014-08-21 LAB — MEASLES/MUMPS/RUBELLA IMMUNITY
MUMPS IGG: 8.35 [AU]/ml (ref <9.00)
Rubella: 2.07 Index — ABNORMAL HIGH (ref <0.90)
Rubeola IgG: 17 AU/mL (ref <25.00)

## 2014-08-21 LAB — VARICELLA ZOSTER ANTIBODY, IGG: Varicella IgG: 31.76 Index (ref <135.00)

## 2014-08-25 ENCOUNTER — Telehealth: Payer: Self-pay | Admitting: Internal Medicine

## 2014-08-25 NOTE — Telephone Encounter (Signed)
Pt is return Taylor Yates from Friday 08-22-14

## 2014-08-25 NOTE — Telephone Encounter (Signed)
Spoke with patient.

## 2014-08-26 ENCOUNTER — Other Ambulatory Visit: Payer: Self-pay | Admitting: Internal Medicine

## 2014-08-26 DIAGNOSIS — Z0000 Encounter for general adult medical examination without abnormal findings: Secondary | ICD-10-CM

## 2014-08-27 ENCOUNTER — Ambulatory Visit (INDEPENDENT_AMBULATORY_CARE_PROVIDER_SITE_OTHER): Payer: BLUE CROSS/BLUE SHIELD | Admitting: *Deleted

## 2014-08-27 DIAGNOSIS — Z23 Encounter for immunization: Secondary | ICD-10-CM | POA: Diagnosis not present

## 2014-08-27 DIAGNOSIS — Z0000 Encounter for general adult medical examination without abnormal findings: Secondary | ICD-10-CM | POA: Diagnosis not present

## 2014-08-28 ENCOUNTER — Telehealth: Payer: Self-pay | Admitting: Internal Medicine

## 2014-08-28 NOTE — Telephone Encounter (Signed)
Test results are not back yet.

## 2014-08-28 NOTE — Telephone Encounter (Signed)
Pt would like blood work results (tb) and a note from dr Shawna Orleans stating cdc does not recommend getting a varicella vaccine. Pt needs info by tomorrow. Pt is aware md out of office

## 2014-08-29 LAB — QUANTIFERON TB GOLD ASSAY (BLOOD)
Interferon Gamma Release Assay: NEGATIVE
QUANTIFERON NIL VALUE: 0.03 [IU]/mL
Quantiferon Tb Ag Minus Nil Value: 0 IU/mL
TB Ag value: 0.02 IU/mL

## 2014-09-01 ENCOUNTER — Telehealth: Payer: Self-pay | Admitting: Internal Medicine

## 2014-09-01 NOTE — Telephone Encounter (Signed)
Pt would like a call back about test results.Taylor Yates

## 2014-09-01 NOTE — Telephone Encounter (Signed)
Records are not available

## 2014-09-01 NOTE — Telephone Encounter (Signed)
Please have pt call her High School or College to see if they have immunization records

## 2014-09-02 NOTE — Telephone Encounter (Signed)
Pt would like a paper coy of her tdap and quantiferone results/  Must be received to them by 7/28,  Py also needs vericella injection, pt states dr did not want to give varicella, but she has no choice, vvvf but she has no choice to get into a the program, Pt has been scheduled for injection

## 2014-09-03 ENCOUNTER — Telehealth: Payer: Self-pay | Admitting: Internal Medicine

## 2014-09-03 ENCOUNTER — Ambulatory Visit: Payer: BLUE CROSS/BLUE SHIELD | Admitting: *Deleted

## 2014-09-03 NOTE — Telephone Encounter (Signed)
Copy ready for pick up and patient is aware.  Patient cancelled her appointment for vaccination.

## 2014-09-03 NOTE — Telephone Encounter (Signed)
Spoke with patient and copy of Tdap and last lab result for TB are ready for pick up.

## 2014-09-03 NOTE — Telephone Encounter (Signed)
Pt call and cancel her injection. Pt said that Dr Shawna Orleans told her she did not need the varicella. Pt also said the school told her that she did not have to have the injection at this time that they would let her know if and when she needed it.

## 2014-09-03 NOTE — Telephone Encounter (Signed)
She needs a letter stating that she shouldn't get the varicella vaccine again. Because titers said that it was not acquiable to her immune system.

## 2014-09-04 ENCOUNTER — Ambulatory Visit (INDEPENDENT_AMBULATORY_CARE_PROVIDER_SITE_OTHER): Payer: BLUE CROSS/BLUE SHIELD | Admitting: *Deleted

## 2014-09-04 DIAGNOSIS — Z23 Encounter for immunization: Secondary | ICD-10-CM | POA: Diagnosis not present

## 2014-09-04 NOTE — Telephone Encounter (Signed)
MD Shawna Orleans agrees to give vaccine again. Taylor Yates will contact patient to come into office.

## 2014-10-03 ENCOUNTER — Ambulatory Visit (INDEPENDENT_AMBULATORY_CARE_PROVIDER_SITE_OTHER): Payer: BLUE CROSS/BLUE SHIELD | Admitting: *Deleted

## 2014-10-03 DIAGNOSIS — Z23 Encounter for immunization: Secondary | ICD-10-CM | POA: Diagnosis not present

## 2015-02-27 ENCOUNTER — Ambulatory Visit: Payer: Self-pay | Admitting: Internal Medicine

## 2015-03-06 ENCOUNTER — Ambulatory Visit (INDEPENDENT_AMBULATORY_CARE_PROVIDER_SITE_OTHER): Payer: No Typology Code available for payment source | Admitting: Adult Health

## 2015-03-06 ENCOUNTER — Ambulatory Visit: Payer: Self-pay | Admitting: Internal Medicine

## 2015-03-06 ENCOUNTER — Encounter: Payer: Self-pay | Admitting: Adult Health

## 2015-03-06 VITALS — BP 120/82 | HR 106 | Temp 97.4°F | Wt 209.6 lb

## 2015-03-06 DIAGNOSIS — F909 Attention-deficit hyperactivity disorder, unspecified type: Secondary | ICD-10-CM | POA: Diagnosis not present

## 2015-03-06 NOTE — Patient Instructions (Signed)
It was great meeting you!  Please stop up at the front desk and sign a release so that we can get the records from Dr. Anastasia Fiedler.   Follow up with Dr. Shawna Orleans as needed

## 2015-03-06 NOTE — Progress Notes (Signed)
Pre visit review using our clinic review tool, if applicable. No additional management support is needed unless otherwise documented below in the visit note. 
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
## 2015-03-09 ENCOUNTER — Emergency Department (INDEPENDENT_AMBULATORY_CARE_PROVIDER_SITE_OTHER): Payer: No Typology Code available for payment source

## 2015-03-09 ENCOUNTER — Emergency Department (INDEPENDENT_AMBULATORY_CARE_PROVIDER_SITE_OTHER)
Admission: EM | Admit: 2015-03-09 | Discharge: 2015-03-09 | Disposition: A | Discharge status: Home / Self Care | Payer: No Typology Code available for payment source | Source: Home / Self Care | Attending: Family Medicine | Admitting: Family Medicine

## 2015-03-09 ENCOUNTER — Encounter (HOSPITAL_COMMUNITY): Payer: Self-pay | Admitting: Emergency Medicine

## 2015-03-09 DIAGNOSIS — S90121A Contusion of right lesser toe(s) without damage to nail, initial encounter: Secondary | ICD-10-CM | POA: Diagnosis not present

## 2015-03-09 MED ORDER — TRAMADOL HCL 50 MG PO TABS: 50.0000 mg | ORAL_TABLET | Freq: Four times a day (QID) | ORAL | Status: DC | PRN

## 2015-03-09 MED ORDER — BACITRACIN ZINC 500 UNIT/GM EX OINT
TOPICAL_OINTMENT | CUTANEOUS | Status: AC
  Filled 2015-03-09: qty 1.8

## 2015-03-09 MED ORDER — IBUPROFEN 800 MG PO TABS
800.0000 mg | ORAL_TABLET | Freq: Once | ORAL | Status: AC
  Administered 2015-03-09: 800 mg via ORAL

## 2015-03-09 MED ORDER — IBUPROFEN 800 MG PO TABS
ORAL_TABLET | ORAL | Status: AC
  Filled 2015-03-09: qty 1

## 2015-03-09 MED ORDER — BACITRACIN ZINC 500 UNIT/GM EX OINT
TOPICAL_OINTMENT | Freq: Once | CUTANEOUS | Status: AC
  Administered 2015-03-09: 20:00:00 via TOPICAL

## 2015-03-09 NOTE — ED Notes (Signed)
Taylor Napoleon, NP completed " the report of occupational injury...treatment".  This reports was brought to department by patient.  Copy made and left for medical records to scan into patient record.
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
## 2015-03-09 NOTE — Discharge Instructions (Signed)
Contusion Elevate, ice Soak in warm salt water for 2-3 days Watch for infection A contusion is a deep bruise. Contusions are the result of a blunt injury to tissues and muscle fibers under the skin. The injury causes bleeding under the skin. The skin overlying the contusion may turn blue, purple, or yellow. Minor injuries will give you a painless contusion, but more severe contusions may stay painful and swollen for a few weeks.  CAUSES  This condition is usually caused by a blow, trauma, or direct force to an area of the body. SYMPTOMS  Symptoms of this condition include:  Swelling of the injured area.  Pain and tenderness in the injured area.  Discoloration. The area may have redness and then turn blue, purple, or yellow. DIAGNOSIS  This condition is diagnosed based on a physical exam and medical history. An X-ray, CT scan, or MRI may be needed to determine if there are any associated injuries, such as broken bones (fractures). TREATMENT  Specific treatment for this condition depends on what area of the body was injured. In general, the best treatment for a contusion is resting, icing, applying pressure to (compression), and elevating the injured area. This is often called the RICE strategy. Over-the-counter anti-inflammatory medicines may also be recommended for pain control.  HOME CARE INSTRUCTIONS   Rest the injured area.  If directed, apply ice to the injured area:  Put ice in a plastic bag.  Place a towel between your skin and the bag.  Leave the ice on for 20 minutes, 2-3 times per day.  If directed, apply light compression to the injured area using an elastic bandage. Make sure the bandage is not wrapped too tightly. Remove and reapply the bandage as directed by your health care provider.  If possible, raise (elevate) the injured area above the level of your heart while you are sitting or lying down.  Take over-the-counter and prescription medicines only as told by your  health care provider. SEEK MEDICAL CARE IF:  Your symptoms do not improve after several days of treatment.  Your symptoms get worse.  You have difficulty moving the injured area. SEEK IMMEDIATE MEDICAL CARE IF:   You have severe pain.  You have numbness in a hand or foot.  Your hand or foot turns pale or cold.   This information is not intended to replace advice given to you by your health care provider. Make sure you discuss any questions you have with your health care provider.   Document Released: 11/03/2004 Document Revised: 10/15/2014 Document Reviewed: 06/11/2014 Elsevier Interactive Patient Education 2016 Elsevier [employer]  Subungual Hematoma  A subungual hematoma is a pocket of blood under the fingernail or toenail. The nail may turn blue or feel painful. HOME CARE  Put ice on the injured area.  Put ice in a plastic bag.  Place a towel between your skin and the bag.  Leave the ice on for 15-20 minutes, 03-04 times a day. Do this for the first 1 to 2 days.  Raise (elevate) the injured area to lessen pain and puffiness (swelling).  If you were given a bandage, wear it for as long as told by your doctor.  If part of your nail falls off, trim the rest of the nail gently.  Only take medicines as told by your doctor. GET HELP RIGHT AWAY IF:  You have redness or puffiness around the nail.  You have yellowish-white fluid (pus) coming from the nail.  Your pain does not get  better with medicine.  You have a fever. MAKE SURE YOU:  Understand these instructions.  Will watch your condition.  Will get help right away if you are not doing well or get worse.   This information is not intended to replace advice given to you by your health care provider. Make sure you discuss any questions you have with your health care provider.   Document Released: 04/18/2011 Document Reviewed: 06/11/2014 Elsevier Interactive Patient Education [employer].

## 2015-03-09 NOTE — ED Notes (Signed)
Patient was performing duties as a Company secretary.  She rolled equipment onto right great toe.  Visible bruising, bleeding from under nail

## 2015-03-09 NOTE — ED Notes (Signed)
Patient has paperwork from job to be completed by provider

## 2015-03-20 ENCOUNTER — Encounter: Payer: Self-pay | Admitting: Adult Health

## 2015-03-20 ENCOUNTER — Ambulatory Visit (INDEPENDENT_AMBULATORY_CARE_PROVIDER_SITE_OTHER): Payer: No Typology Code available for payment source | Admitting: Adult Health

## 2015-03-20 VITALS — BP 98/70 | HR 102 | Temp 97.8°F | Wt 209.5 lb

## 2015-03-20 DIAGNOSIS — M79674 Pain in right toe(s): Secondary | ICD-10-CM | POA: Diagnosis not present

## 2015-03-20 MED ORDER — AMPHETAMINE-DEXTROAMPHETAMINE 15 MG PO TABS: 15.0000 mg | ORAL_TABLET | Freq: Two times a day (BID) | ORAL | Status: DC
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
## 2015-03-20 NOTE — Progress Notes (Signed)
Pre visit review using our clinic review tool, if applicable. No additional management support is needed unless otherwise documented below in the visit note. 

## 2015-03-24 ENCOUNTER — Telehealth: Payer: Self-pay | Admitting: Internal Medicine

## 2015-03-24 NOTE — Telephone Encounter (Signed)
Patient would like a prescription for a knee scooter because she can barely walk and it's putting a lot of strain on her feet, ankles and knees. Also, she said for future reference, Dr. Verl Blalock prescribed her Hyoscyamine 0.125mg  for GI flare ups.  She wanted this to be noted in her file.

## 2015-03-25 NOTE — Telephone Encounter (Signed)
Left message on machine for patient to return.  Medication added to list.

## 2015-03-25 NOTE — Telephone Encounter (Signed)
Ok for rx for knee scooter

## 2015-03-26 NOTE — Telephone Encounter (Signed)
Rx ready for pick up and patient is aware 

## 2015-04-09 ENCOUNTER — Encounter: Payer: Self-pay | Admitting: Adult Health

## 2015-04-09 ENCOUNTER — Ambulatory Visit (INDEPENDENT_AMBULATORY_CARE_PROVIDER_SITE_OTHER): Payer: No Typology Code available for payment source | Admitting: Adult Health

## 2015-04-09 VITALS — BP 110/80 | Temp 97.7°F | Ht 64.0 in | Wt 209.3 lb

## 2015-04-09 DIAGNOSIS — M25561 Pain in right knee: Secondary | ICD-10-CM
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
## 2015-04-09 NOTE — Progress Notes (Signed)
Pre visit review using our clinic review tool, if applicable. No additional management support is needed unless otherwise documented below in the visit note. 

## 2015-04-09 NOTE — Patient Instructions (Signed)
I will call you when I get the results of your knee x ray back.   Sports Med will call you to schedule an appointment  Ibuprofen 600mg  every 8 hours   Ice your legs as much as you can

## 2015-04-13 ENCOUNTER — Telehealth: Payer: Self-pay | Admitting: Internal Medicine

## 2015-04-13 NOTE — Telephone Encounter (Signed)
Pt would like a temporary handicap sticker for school.

## 2015-04-14 NOTE — Telephone Encounter (Signed)
Done

## 2015-04-15 NOTE — Telephone Encounter (Signed)
Pt aware form is ready for pickup 

## 2015-04-29 ENCOUNTER — Ambulatory Visit: Payer: No Typology Code available for payment source | Admitting: Family Medicine

## 2015-06-02 ENCOUNTER — Telehealth: Payer: Self-pay | Admitting: Internal Medicine

## 2015-06-02 MED ORDER — AMPHETAMINE-DEXTROAMPHETAMINE 15 MG PO TABS: 15.0000 mg | ORAL_TABLET | Freq: Two times a day (BID) | ORAL | Status: DC

## 2015-06-02 NOTE — Telephone Encounter (Signed)
Rx ready for pick up.  Left message on machine for patient to establish with a new provider for further refills.

## 2015-06-02 NOTE — Telephone Encounter (Signed)
Pt need new Rx for Adderall  Pharm:  [facility].

## 2015-06-03 ENCOUNTER — Telehealth: Payer: Self-pay | Admitting: Internal Medicine

## 2015-06-03 NOTE — Telephone Encounter (Signed)
Pt need new Rx for [place].  Pharm: [facility]

## 2015-06-04 MED ORDER — AMPHETAMINE-DEXTROAMPHETAMINE 15 MG PO TABS: 15.0000 mg | ORAL_TABLET | Freq: Two times a day (BID) | ORAL | Status: DC

## 2015-06-04 NOTE — Telephone Encounter (Signed)
Left message on machine for patient rx ready for pick up.  Patient should schedule an appointment to establish with a new provider.

## 2015-07-03 ENCOUNTER — Ambulatory Visit (INDEPENDENT_AMBULATORY_CARE_PROVIDER_SITE_OTHER): Payer: No Typology Code available for payment source | Admitting: Adult Health

## 2015-07-03 ENCOUNTER — Encounter: Payer: Self-pay | Admitting: Adult Health

## 2015-07-03 VITALS — BP 118/64 | Temp 98.3°F | Wt 198.0 lb

## 2015-07-03 DIAGNOSIS — F909 Attention-deficit hyperactivity disorder, unspecified type: Secondary | ICD-10-CM

## 2015-07-03 DIAGNOSIS — F988 Other specified behavioral and emotional disorders with onset usually occurring in childhood and adolescence: Secondary | ICD-10-CM

## 2015-07-03 MED ORDER — AMPHETAMINE-DEXTROAMPHETAMINE 15 MG PO TABS: 15.0000 mg | ORAL_TABLET | Freq: Two times a day (BID) | ORAL | Status: DC
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
## 2015-07-03 NOTE — Patient Instructions (Signed)
Have a great Memorial Day weekend.   Make an appointment to establish care with Dr. Martinique   If you need anything please let me know

## 2015-08-07 ENCOUNTER — Encounter: Payer: Self-pay | Admitting: Family Medicine

## 2015-08-07 ENCOUNTER — Ambulatory Visit (INDEPENDENT_AMBULATORY_CARE_PROVIDER_SITE_OTHER): Payer: No Typology Code available for payment source | Admitting: Family Medicine

## 2015-08-07 VITALS — BP 122/90 | HR 96 | Temp 98.4°F | Resp 12 | Ht 64.0 in | Wt 200.0 lb

## 2015-08-07 DIAGNOSIS — Z6834 Body mass index (BMI) 34.0-34.9, adult: Secondary | ICD-10-CM | POA: Diagnosis not present

## 2015-08-07 DIAGNOSIS — F909 Attention-deficit hyperactivity disorder, unspecified type: Secondary | ICD-10-CM

## 2015-08-07 DIAGNOSIS — F988 Other specified behavioral and emotional disorders with onset usually occurring in childhood and adolescence: Secondary | ICD-10-CM

## 2015-08-07 DIAGNOSIS — E039 Hypothyroidism, unspecified: Secondary | ICD-10-CM | POA: Diagnosis not present

## 2015-08-07 LAB — T4, FREE: FREE T4: 0.58 ng/dL — AB (ref 0.60–1.60)

## 2015-08-07 LAB — TSH: TSH: 1.48 u[IU]/mL (ref 0.35–4.50)

## 2015-08-07 LAB — T3, FREE: T3 FREE: 3.7 pg/mL (ref 2.3–4.2)

## 2015-08-07 MED ORDER — THYROID 65 MG PO TABS: 65.0000 mg | ORAL_TABLET | Freq: Every day | ORAL | Status: DC

## 2015-08-07 MED ORDER — AMPHETAMINE-DEXTROAMPHETAMINE 15 MG PO TABS: 15.0000 mg | ORAL_TABLET | Freq: Two times a day (BID) | ORAL | Status: DC
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
## 2015-08-07 NOTE — Progress Notes (Signed)
Pre visit review using our clinic review tool, if applicable. No additional management support is needed unless otherwise documented below in the visit note. 

## 2015-08-07 NOTE — Patient Instructions (Signed)
A few things to remember from today's visit:   1. ADD (attention deficit disorder)  - amphetamine-dextroamphetamine (ADDERALL) 15 MG tablet; Take 1 tablet by mouth 2 (two) times daily.  Dispense: 60 tablet; Refill: 0  2. Primary hypothyroidism  - thyroid (NATURE-THROID) 65 MG tablet; Take 1 tablet (65 mg total) by mouth daily.  Dispense: 90 tablet; Refill: 2 - TSH - T4, free - T3, free     [name], today we have followed on some of your chronic medical problems and they seem to be stable, so no changes in current management today.  Review medication list and be sure if is accurate.  -Remember a healthy diet and regular physical activity are very important for prevention as well as for well being; they also help with many chronic problems, decreasing the need of adding new medications and delaying or preventing possible complications.  Remember to arrange your follow up appt before leaving today. Please follow sooner than planned if a new concern arises.     If you sign-up for My chart, you can communicate easier with [place] in case you have any question or concern.

## 2015-08-10 ENCOUNTER — Telehealth: Payer: Self-pay | Admitting: Family Medicine

## 2015-08-10 NOTE — Telephone Encounter (Signed)
Left voicemail for patient to call the office back.   

## 2015-08-10 NOTE — Telephone Encounter (Signed)
Pt would like blood work results °

## 2015-08-12 NOTE — Telephone Encounter (Signed)
Called and left a detailed message on pt's voicemail letting her know everything was normal & to give [place] a call if she had any questions.

## 2015-09-02 ENCOUNTER — Telehealth: Payer: Self-pay | Admitting: Family Medicine

## 2015-09-02 MED ORDER — THYROID 81.25 MG PO TABS: 81.2500 mg | ORAL_TABLET | Freq: Every day | ORAL | 2 refills | Status: DC

## 2015-09-02 NOTE — Telephone Encounter (Signed)
New Rx sent to pharmacy

## 2015-09-02 NOTE — Telephone Encounter (Signed)
Pt is calling to let md know she gave the incorrect information. Pt takes nature throid 81.25 mg. Pt needs new rx sent to [facility] on [address]

## 2015-09-25 ENCOUNTER — Telehealth: Payer: Self-pay | Admitting: Family Medicine

## 2015-09-29 NOTE — Telephone Encounter (Signed)
error 

## 2015-10-13 ENCOUNTER — Ambulatory Visit (INDEPENDENT_AMBULATORY_CARE_PROVIDER_SITE_OTHER): Payer: No Typology Code available for payment source | Admitting: Family Medicine

## 2015-10-13 ENCOUNTER — Encounter: Payer: Self-pay | Admitting: Family Medicine

## 2015-10-13 DIAGNOSIS — F909 Attention-deficit hyperactivity disorder, unspecified type: Secondary | ICD-10-CM

## 2015-10-13 DIAGNOSIS — F988 Other specified behavioral and emotional disorders with onset usually occurring in childhood and adolescence: Secondary | ICD-10-CM

## 2015-10-13 DIAGNOSIS — F419 Anxiety disorder, unspecified: Secondary | ICD-10-CM | POA: Insufficient documentation

## 2015-10-13 DIAGNOSIS — G4700 Insomnia, unspecified: Secondary | ICD-10-CM

## 2015-10-13 MED ORDER — ATOMOXETINE HCL 40 MG PO CAPS: ORAL_CAPSULE | ORAL | 1 refills | Status: DC

## 2015-10-13 MED ORDER — MELATONIN ER 5 MG PO TBCR: 5.0000 mg | EXTENDED_RELEASE_TABLET | Freq: Every day | ORAL | 3 refills | Status: DC

## 2015-10-13 MED ORDER — AMPHETAMINE-DEXTROAMPHETAMINE 15 MG PO TABS: 15.0000 mg | ORAL_TABLET | Freq: Two times a day (BID) | ORAL | 0 refills | Status: DC

## 2015-10-13 MED ORDER — THYROID 81.25 MG PO TABS: 81.2500 mg | ORAL_TABLET | Freq: Every day | ORAL | 1 refills | Status: DC
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
## 2015-10-13 NOTE — Patient Instructions (Signed)
A few things to remember from today's visit:   ADD (attention deficit disorder) - Plan: amphetamine-dextroamphetamine (ADDERALL) 15 MG tablet, atomoxetine (STRATTERA) 40 MG capsule  No changes on Adderall for now. Strattera started today, start with 1 capsule daily for 2-3 weeks and increase to 2 capsules daily as tolerated. I will see her back in 4 weeks and we will plan on doing routine examination with fasting labs    Please be sure medication list is accurate. If a new problem present, please set up appointment sooner than planned today.

## 2015-10-13 NOTE — Progress Notes (Signed)
Pre visit review using our clinic review tool, if applicable. No additional management support is needed unless otherwise documented below in the visit note. 

## 2015-11-17 ENCOUNTER — Telehealth: Payer: Self-pay | Admitting: Family Medicine

## 2015-11-17 ENCOUNTER — Ambulatory Visit: Payer: No Typology Code available for payment source | Admitting: Family Medicine

## 2015-11-17 NOTE — Telephone Encounter (Signed)
Pt wanted you to know that she has not tried the new Rx Strattera due to the fact it cost $211.00 for generic and she is not able to afford it.  Pt wanted to know if there is a program that could assist her with the cost of the medication. She has been busy and apologize for not letting you know before now.

## 2015-11-17 NOTE — Telephone Encounter (Signed)
Called and spoke with patient. Let her know there is a savings card on the Strattera website and to let us know if it is still to expensive after the savings card.

## 2015-12-11 ENCOUNTER — Encounter: Payer: Self-pay | Admitting: Family Medicine

## 2015-12-11 ENCOUNTER — Ambulatory Visit (INDEPENDENT_AMBULATORY_CARE_PROVIDER_SITE_OTHER): Payer: 59 | Admitting: Family Medicine

## 2015-12-11 ENCOUNTER — Other Ambulatory Visit (HOSPITAL_COMMUNITY)
Admission: RE | Admit: 2015-12-11 | Discharge: 2015-12-11 | Disposition: A | Discharge status: Home / Self Care | Payer: 59 | Source: Ambulatory Visit | Attending: Family Medicine | Admitting: Family Medicine

## 2015-12-11 VITALS — BP 122/80 | HR 83 | Resp 12 | Ht 64.0 in | Wt 197.5 lb

## 2015-12-11 DIAGNOSIS — Z0001 Encounter for general adult medical examination with abnormal findings: Secondary | ICD-10-CM

## 2015-12-11 DIAGNOSIS — Z01419 Encounter for gynecological examination (general) (routine) without abnormal findings: Secondary | ICD-10-CM

## 2015-12-11 DIAGNOSIS — F988 Other specified behavioral and emotional disorders with onset usually occurring in childhood and adolescence: Secondary | ICD-10-CM

## 2015-12-11 DIAGNOSIS — E784 Other hyperlipidemia: Secondary | ICD-10-CM | POA: Diagnosis not present

## 2015-12-11 DIAGNOSIS — Z131 Encounter for screening for diabetes mellitus: Secondary | ICD-10-CM

## 2015-12-11 DIAGNOSIS — E785 Hyperlipidemia, unspecified: Secondary | ICD-10-CM

## 2015-12-11 DIAGNOSIS — E538 Deficiency of other specified B group vitamins: Secondary | ICD-10-CM | POA: Diagnosis not present

## 2015-12-11 DIAGNOSIS — Z1151 Encounter for screening for human papillomavirus (HPV): Secondary | ICD-10-CM | POA: Diagnosis not present

## 2015-12-11 LAB — BASIC METABOLIC PANEL
BUN: 10 mg/dL (ref 6–23)
CHLORIDE: 103 meq/L (ref 96–112)
CO2: 27 meq/L (ref 19–32)
Calcium: 9.4 mg/dL (ref 8.4–10.5)
Creatinine, Ser: 0.75 mg/dL (ref 0.40–1.20)
GFR: 92.1 mL/min (ref >60.00)
Glucose, Bld: 89 mg/dL (ref 70–99)
POTASSIUM: 4.3 meq/L (ref 3.5–5.1)
SODIUM: 137 meq/L (ref 135–145)

## 2015-12-11 LAB — LIPID PANEL
Cholesterol: 160 mg/dL (ref 0–200)
HDL: 39.8 mg/dL (ref >39.00)
LDL Cholesterol: 102 mg/dL — ABNORMAL HIGH (ref 0–99)
NONHDL: 120.14
Total CHOL/HDL Ratio: 4
Triglycerides: 92 mg/dL (ref 0.0–149.0)
VLDL: 18.4 mg/dL (ref 0.0–40.0)

## 2015-12-11 LAB — VITAMIN B12: Vitamin B-12: 157 pg/mL — ABNORMAL LOW (ref 211–911)

## 2015-12-11 MED ORDER — LISDEXAMFETAMINE DIMESYLATE 20 MG PO CAPS: 20.0000 mg | ORAL_CAPSULE | Freq: Every day | ORAL | 0 refills | Status: DC

## 2015-12-11 NOTE — Progress Notes (Signed)
Pre visit review using our clinic review tool, if applicable. No additional management support is needed unless otherwise documented below in the visit note. 

## 2015-12-11 NOTE — Patient Instructions (Signed)
A few things to remember from today's visit:   Encounter for gynecological examination without abnormal finding - Plan: PAP [[place]]  Dyslipidemia (high LDL; low HDL) - Plan: Lipid panel  Vitamin B12 deficiency - Plan: Vitamin B12  Diabetes mellitus screening - Plan: Basic metabolic panel  Attention deficit disorder, unspecified hyperactivity presence - Plan: lisdexamfetamine (VYVANSE) 20 MG capsule    At least 150 minutes of moderate exercise per week, daily brisk walking for 15-30 min is a good exercise option. Healthy diet low in saturated (animal) fats and sweets and consisting of fresh fruits and vegetables, lean meats such as fish and white chicken and whole grains.   - Vaccines:  Tdap vaccine every 10 years.  Shingles vaccine recommended at age 60, could be given after 37 years of age but not sure about insurance coverage.  Pneumonia vaccines:  Prevnar 13 at 65 and Pneumovax at 63.  Screening recommendations for low/normal risk women:  Screening for diabetes at age 72-45 and every 3 years.  Cervical cancer prevention:  -HPV vaccination between 62-62 years old. -Pap smear starts at 37 years of age and continues periodically until 37 years old in low risk women. Pap smear every 3 years between 25 and 26 years old. Pap smear every 3 years between women 86 and older if pap smear negative and HPV screening negative.   -Breast cancer: Mammogram: There is disagreement between experts about when to start screening in low risk asymptomatic female but recent recommendations are to start screening at 33 and not later than 37 years old , every 1-2 years and after 37 yo q 2 years. Screening is recommended until 37 years old but some women can continue screening depending of healthy issues.   Colon cancer screening: starts at 37 years old until 37 years old.  Cholesterol disorder screening at age 21 and every 3 years.
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
## 2015-12-14 LAB — CYTOLOGY - PAP
Adequacy: ABSENT
DIAGNOSIS: NEGATIVE
HPV: NOT DETECTED

## 2016-01-22 ENCOUNTER — Ambulatory Visit (INDEPENDENT_AMBULATORY_CARE_PROVIDER_SITE_OTHER): Payer: 59 | Admitting: Family Medicine

## 2016-01-22 ENCOUNTER — Encounter: Payer: Self-pay | Admitting: Family Medicine

## 2016-01-22 VITALS — BP 130/80 | HR 99 | Resp 12 | Ht 64.0 in | Wt 200.4 lb

## 2016-01-22 DIAGNOSIS — F988 Other specified behavioral and emotional disorders with onset usually occurring in childhood and adolescence: Secondary | ICD-10-CM

## 2016-01-22 DIAGNOSIS — Z6834 Body mass index (BMI) 34.0-34.9, adult: Secondary | ICD-10-CM | POA: Diagnosis not present

## 2016-01-22 DIAGNOSIS — Z6837 Body mass index (BMI) 37.0-37.9, adult: Secondary | ICD-10-CM

## 2016-01-22 DIAGNOSIS — E66812 Obesity, class 2: Secondary | ICD-10-CM | POA: Insufficient documentation

## 2016-01-22 DIAGNOSIS — F418 Other specified anxiety disorders: Secondary | ICD-10-CM

## 2016-01-22 DIAGNOSIS — E538 Deficiency of other specified B group vitamins: Secondary | ICD-10-CM

## 2016-01-22 DIAGNOSIS — E669 Obesity, unspecified: Secondary | ICD-10-CM | POA: Insufficient documentation

## 2016-01-22 MED ORDER — AMPHETAMINE-DEXTROAMPHET ER 20 MG PO CP24: 20.0000 mg | ORAL_CAPSULE | Freq: Every day | ORAL | 0 refills | Status: DC

## 2016-01-22 MED ORDER — CYANOCOBALAMIN 1000 MCG/ML IJ SOLN
1000.0000 ug | Freq: Once | INTRAMUSCULAR | Status: AC
  Administered 2016-01-22: 1000 ug via INTRAMUSCULAR

## 2016-01-22 MED ORDER — FLUOXETINE HCL 20 MG PO CAPS: 20.0000 mg | ORAL_CAPSULE | Freq: Every day | ORAL | 2 refills | Status: DC

## 2016-01-22 NOTE — Patient Instructions (Addendum)
A few things to remember from today's visit:   Attention deficit disorder, unspecified hyperactivity presence - Plan: amphetamine-dextroamphetamine (ADDERALL XR) 20 MG 24 hr capsule  B12 deficiency  Other specified anxiety disorders - Plan: FLUoxetine (PROZAC) 20 MG capsule  Adderal long acting to try. Fluoxetine to start.  Let me know through my Chart about insurance coverage and tolerance.   Please be sure medication list is accurate. If a new problem present, please set up appointment sooner than planned today.

## 2016-01-22 NOTE — Progress Notes (Signed)
Pre visit review using our clinic review tool, if applicable. No additional management support is needed unless otherwise documented below in the visit note. 
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
## 2016-02-11 ENCOUNTER — Telehealth: Payer: Self-pay

## 2016-02-11 NOTE — Telephone Encounter (Signed)
Received fax from pharmacy stating that the Nature-Throid is on back order. Is there an alternative?

## 2016-02-11 NOTE — Telephone Encounter (Signed)
Armour Thyroid is another options, 90 mg daily x 6 days and 1/2 x 1 day. TSH in 2-3 months [during OV].  Thanks, BJ

## 2016-02-12 MED ORDER — THYROID 90 MG PO TABS: ORAL_TABLET | ORAL | 0 refills | Status: DC

## 2016-02-12 NOTE — Telephone Encounter (Signed)
Rx sent 

## 2016-02-17 ENCOUNTER — Encounter: Payer: Self-pay | Admitting: Adult Health

## 2016-03-14 ENCOUNTER — Telehealth: Payer: Self-pay | Admitting: Family Medicine

## 2016-03-14 DIAGNOSIS — F988 Other specified behavioral and emotional disorders with onset usually occurring in childhood and adolescence: Secondary | ICD-10-CM

## 2016-03-14 MED ORDER — AMPHETAMINE-DEXTROAMPHET ER 20 MG PO CP24: 20.0000 mg | ORAL_CAPSULE | Freq: Every day | ORAL | 0 refills | Status: DC

## 2016-03-14 NOTE — Telephone Encounter (Signed)
Left voicemail letting patient know that Rx is up front & ready for pick up.

## 2016-03-14 NOTE — Telephone Encounter (Signed)
Rx printed

## 2016-03-14 NOTE — Telephone Encounter (Signed)
Rx for Adderall XR 20 mg can be printed to continue once daily, # 30/0. Thanks, BJ

## 2016-03-14 NOTE — Telephone Encounter (Signed)
Pt need new Rx for Amphetamine-dextroamphetamine  (ADDERALL XR) 20 mg   Pt is aware of the 3 business days for refills and state that she will be out of her medication after tomorrow.

## 2016-03-28 ENCOUNTER — Ambulatory Visit: Payer: 59 | Admitting: Family Medicine

## 2016-04-21 ENCOUNTER — Telehealth: Payer: Self-pay | Admitting: Family Medicine

## 2016-04-21 NOTE — Telephone Encounter (Signed)
Pt would like to see if Dr. Martinique would consider giving her Ritalin in place of amphetamine-dextroamphetamine ER she has been on this medication before from elementary to college and her previous provider changed it, but it worked well with the body it helped her focus if she does not want to change her to Ritalin she would like to have Rx amphetamine-dextroamphetamine ER   Pt is aware of 3 business days for refills and our office will give her a call wihen ready for pick up.

## 2016-04-22 MED ORDER — METHYLPHENIDATE HCL ER (LA) 20 MG PO CP24: 20.0000 mg | ORAL_CAPSULE | ORAL | 0 refills | Status: DC

## 2016-04-22 NOTE — Telephone Encounter (Signed)
She can try Ritalin LA 20 mg daily in the morning, #30/0. F/U in 4 weeks. Thanks, BJ

## 2016-04-22 NOTE — Telephone Encounter (Signed)
Left voicemail letting patient know Rx is up front & ready for pick up. Also advised she needs a 4 week f/u and can make the appointment when she comes to get the Rx.

## 2016-04-22 NOTE — Telephone Encounter (Signed)
Rx printed

## 2016-05-23 ENCOUNTER — Other Ambulatory Visit: Payer: Self-pay | Admitting: Family Medicine

## 2016-05-25 ENCOUNTER — Ambulatory Visit (INDEPENDENT_AMBULATORY_CARE_PROVIDER_SITE_OTHER): Payer: 59 | Admitting: Family Medicine

## 2016-05-25 ENCOUNTER — Encounter: Payer: Self-pay | Admitting: Family Medicine

## 2016-05-25 VITALS — BP 106/80 | HR 78 | Resp 12 | Ht 64.0 in | Wt 189.5 lb

## 2016-05-25 DIAGNOSIS — Z6832 Body mass index (BMI) 32.0-32.9, adult: Secondary | ICD-10-CM

## 2016-05-25 DIAGNOSIS — F418 Other specified anxiety disorders: Secondary | ICD-10-CM | POA: Diagnosis not present

## 2016-05-25 DIAGNOSIS — E538 Deficiency of other specified B group vitamins: Secondary | ICD-10-CM

## 2016-05-25 DIAGNOSIS — E669 Obesity, unspecified: Secondary | ICD-10-CM | POA: Diagnosis not present

## 2016-05-25 DIAGNOSIS — E039 Hypothyroidism, unspecified: Secondary | ICD-10-CM

## 2016-05-25 DIAGNOSIS — F988 Other specified behavioral and emotional disorders with onset usually occurring in childhood and adolescence: Secondary | ICD-10-CM | POA: Diagnosis not present

## 2016-05-25 MED ORDER — SYRINGE 2-3 ML 3 ML MISC: 1.0000 | 3 refills | Status: DC

## 2016-05-25 MED ORDER — FLUOXETINE HCL 20 MG PO CAPS: 20.0000 mg | ORAL_CAPSULE | Freq: Every day | ORAL | 2 refills | Status: DC

## 2016-05-25 MED ORDER — "NEEDLE (DISP) 22G X 1-1/2"" MISC": 2.0000 [IU] | 3 refills | Status: DC

## 2016-05-25 MED ORDER — CYANOCOBALAMIN 1000 MCG/ML IJ SOLN: 1000.0000 ug | INTRAMUSCULAR | 3 refills | Status: DC

## 2016-05-25 MED ORDER — METHYLPHENIDATE HCL ER (LA) 20 MG PO CP24: 20.0000 mg | ORAL_CAPSULE | ORAL | 0 refills | Status: DC

## 2016-05-25 MED ORDER — CYANOCOBALAMIN 1000 MCG/ML IJ SOLN
1000.0000 ug | Freq: Once | INTRAMUSCULAR | Status: AC
  Administered 2016-05-25: 1000 ug via INTRAMUSCULAR

## 2016-05-25 MED ORDER — THYROID 90 MG PO TABS: ORAL_TABLET | ORAL | 1 refills | Status: DC

## 2016-05-25 NOTE — Progress Notes (Signed)
Pre visit review using our clinic review tool, if applicable. No additional management support is needed unless otherwise documented below in the visit note. 

## 2016-05-25 NOTE — Patient Instructions (Addendum)
A few things to remember from today's visit:   Attention deficit disorder, unspecified hyperactivity presence - Plan: methylphenidate (RITALIN LA) 20 MG 24 hr capsule  Other specified anxiety disorders - Plan: FLUoxetine (PROZAC) 20 MG capsule  B12 deficiency  Primary hypothyroidism - Plan: thyroid (ARMOUR THYROID) 90 MG tablet  B12 1000 mcg q 3-4 weeks IM.   Today we started Prozac, this type of medications can increase suicidal risk. This is more prevalent among children,adolecents, and young adults with major depression or other psychiatric disorders. It can also make depression worse. Most common side effects are gastrointestinal, self limited after a few weeks: diarrhea, nausea, constipation  Or diarrhea among some.  In general it is well tolerated. We will follow closely.  In about 4 weeks please let me know through My Chart or by calling the office about tolerance of new medication.      Please be sure medication list is accurate. If a new problem present, please set up appointment sooner than planned today.
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
## 2016-06-01 ENCOUNTER — Ambulatory Visit: Payer: 59 | Admitting: Family Medicine

## 2016-08-23 ENCOUNTER — Encounter: Payer: Self-pay | Admitting: Family Medicine

## 2016-08-23 ENCOUNTER — Other Ambulatory Visit: Payer: Self-pay | Admitting: Family Medicine

## 2016-08-23 ENCOUNTER — Ambulatory Visit (INDEPENDENT_AMBULATORY_CARE_PROVIDER_SITE_OTHER): Payer: 59 | Admitting: Family Medicine

## 2016-08-23 VITALS — BP 120/80 | HR 88 | Resp 12 | Ht 64.0 in | Wt 212.1 lb

## 2016-08-23 DIAGNOSIS — Z6836 Body mass index (BMI) 36.0-36.9, adult: Secondary | ICD-10-CM | POA: Diagnosis not present

## 2016-08-23 DIAGNOSIS — S80861A Insect bite (nonvenomous), right lower leg, initial encounter: Secondary | ICD-10-CM

## 2016-08-23 DIAGNOSIS — R5383 Other fatigue: Secondary | ICD-10-CM

## 2016-08-23 DIAGNOSIS — W57XXXA Bitten or stung by nonvenomous insect and other nonvenomous arthropods, initial encounter: Secondary | ICD-10-CM

## 2016-08-23 DIAGNOSIS — E669 Obesity, unspecified: Secondary | ICD-10-CM

## 2016-08-23 NOTE — Patient Instructions (Signed)
  [name] I have seen you today for an acute visit.  A few things to remember from today's visit:   Tick bite of right lower leg, initial encounter - Plan: B. burgdorfi antibodies  For now no medication recommended.   In general please monitor for signs of worsening symptoms and seek immediate medical attention if any concerning.     Please keep your next follow up visit.
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
## 2016-08-24 LAB — LYME AB/WESTERN BLOT REFLEX
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
## 2016-09-21 ENCOUNTER — Ambulatory Visit: Payer: 59 | Admitting: Family Medicine

## 2016-09-22 ENCOUNTER — Ambulatory Visit: Payer: 59 | Admitting: Family Medicine
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
## 2016-10-31 ENCOUNTER — Ambulatory Visit: Payer: 59 | Admitting: Family Medicine

## 2016-10-31 ENCOUNTER — Telehealth: Payer: Self-pay | Admitting: Family Medicine
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
## 2016-10-31 NOTE — Telephone Encounter (Signed)
Pt request refill  methylphenidate (RITALIN LA) 20 MG 24 hr capsule

## 2016-10-31 NOTE — Telephone Encounter (Signed)
I can give her Rx tomorrow during visit. Thanks, BJ

## 2016-10-31 NOTE — Telephone Encounter (Signed)
Patient is coming in tomorrow for her follow up appointment.

## 2016-11-01 ENCOUNTER — Ambulatory Visit (INDEPENDENT_AMBULATORY_CARE_PROVIDER_SITE_OTHER): Payer: 59 | Admitting: Family Medicine

## 2016-11-01 ENCOUNTER — Encounter: Payer: Self-pay | Admitting: Family Medicine

## 2016-11-01 VITALS — BP 118/80 | HR 78 | Resp 12 | Ht 64.0 in | Wt 220.1 lb

## 2016-11-01 DIAGNOSIS — E039 Hypothyroidism, unspecified: Secondary | ICD-10-CM | POA: Diagnosis not present

## 2016-11-01 DIAGNOSIS — E669 Obesity, unspecified: Secondary | ICD-10-CM

## 2016-11-01 DIAGNOSIS — R5383 Other fatigue: Secondary | ICD-10-CM

## 2016-11-01 DIAGNOSIS — F988 Other specified behavioral and emotional disorders with onset usually occurring in childhood and adolescence: Secondary | ICD-10-CM

## 2016-11-01 DIAGNOSIS — F418 Other specified anxiety disorders: Secondary | ICD-10-CM

## 2016-11-01 DIAGNOSIS — G4700 Insomnia, unspecified: Secondary | ICD-10-CM | POA: Diagnosis not present

## 2016-11-01 DIAGNOSIS — Z6837 Body mass index (BMI) 37.0-37.9, adult: Secondary | ICD-10-CM | POA: Diagnosis not present

## 2016-11-01 DIAGNOSIS — E538 Deficiency of other specified B group vitamins: Secondary | ICD-10-CM | POA: Diagnosis not present

## 2016-11-01 DIAGNOSIS — E66812 Obesity, class 2: Secondary | ICD-10-CM

## 2016-11-01 LAB — T4, FREE: FREE T4: 0.71 ng/dL (ref 0.60–1.60)

## 2016-11-01 LAB — T3, FREE: T3 FREE: 3.9 pg/mL (ref 2.3–4.2)

## 2016-11-01 LAB — VITAMIN B12: Vitamin B-12: 220 pg/mL (ref 211–911)

## 2016-11-01 LAB — TSH: TSH: 0.22 u[IU]/mL — AB (ref 0.35–4.50)

## 2016-11-01 LAB — VITAMIN D 25 HYDROXY (VIT D DEFICIENCY, FRACTURES): VITD: 13.45 ng/mL — AB (ref 30.00–100.00)

## 2016-11-01 MED ORDER — FLUOXETINE HCL 20 MG PO CAPS: 20.0000 mg | ORAL_CAPSULE | Freq: Every day | ORAL | 2 refills | Status: DC

## 2016-11-01 MED ORDER — METHYLPHENIDATE HCL ER (LA) 20 MG PO CP24: 20.0000 mg | ORAL_CAPSULE | ORAL | 0 refills | Status: DC

## 2016-11-01 MED ORDER — THYROID 90 MG PO TABS: ORAL_TABLET | ORAL | 2 refills | Status: DC

## 2016-11-01 NOTE — Patient Instructions (Signed)
A few things to remember from today's visit:   Attention deficit disorder, unspecified hyperactivity presence - Plan: methylphenidate (RITALIN LA) 20 MG 24 hr capsule, DISCONTINUED: methylphenidate (RITALIN LA) 20 MG 24 hr capsule, DISCONTINUED: methylphenidate (RITALIN LA) 20 MG 24 hr capsule  B12 deficiency - Plan: Vitamin B12  Other specified anxiety disorders - Plan: FLUoxetine (PROZAC) 20 MG capsule  Insomnia, unspecified type  Primary hypothyroidism - Plan: thyroid (ARMOUR THYROID) 90 MG tablet, T3, free, T4, free, TSH  Fatigue, unspecified type - Plan: VITAMIN D 25 Hydroxy (Vit-D Deficiency, Fractures)   Please be sure medication list is accurate. If a new problem present, please set up appointment sooner than planned today.

## 2016-11-07 ENCOUNTER — Encounter: Payer: Self-pay | Admitting: Family Medicine

## 2016-11-09 ENCOUNTER — Other Ambulatory Visit: Payer: Self-pay

## 2016-11-09 DIAGNOSIS — R7989 Other specified abnormal findings of blood chemistry: Secondary | ICD-10-CM

## 2017-03-21 ENCOUNTER — Telehealth: Payer: Self-pay | Admitting: Family Medicine

## 2017-03-21 NOTE — Telephone Encounter (Signed)
Copied from [place]. Topic: Quick Communication - Rx Refill/Question >> Mar 21, 2017  3:40 PM Bea Graff, NT wrote: Medication: methylphenidate (RITALIN LA)   Has the patient contacted their pharmacy? Yes.     (Agent: If no, request that the patient contact the pharmacy for the refill.)   Preferred Pharmacy (with phone number or street name): [facility] on [address]    Agent: Please be advised that RX refills may take up to 3 business days. We ask that you follow-up with your pharmacy.

## 2017-03-22 NOTE — Telephone Encounter (Signed)
Ritalin 20 mg refill Last OV: 11/01/16 Last Refill:01/02/17 Pharmacy:[facility] on [address]

## 2017-03-24 ENCOUNTER — Other Ambulatory Visit: Payer: Self-pay | Admitting: Family Medicine

## 2017-03-24 DIAGNOSIS — F988 Other specified behavioral and emotional disorders with onset usually occurring in childhood and adolescence: Secondary | ICD-10-CM

## 2017-03-24 MED ORDER — METHYLPHENIDATE HCL ER (LA) 20 MG PO CP24: 20.0000 mg | ORAL_CAPSULE | ORAL | 0 refills | Status: DC

## 2017-03-24 NOTE — Telephone Encounter (Signed)
Patient informed that Rx was sent to pharmacy. Patient has follow-up scheduled for Monday, 03/27/2017 at 2 pm.

## 2017-03-24 NOTE — Telephone Encounter (Signed)
She has not followed as we planned, 4 months follow up (which will be 03/2017). I do not se a scheduled appt yet.  I am sending Rx for Ritalin, please advise to arrange an appt before she runs out of her prescription.  Thanks, BJ
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
## 2017-03-27 ENCOUNTER — Encounter: Payer: Self-pay | Admitting: Family Medicine

## 2017-03-27 ENCOUNTER — Ambulatory Visit (INDEPENDENT_AMBULATORY_CARE_PROVIDER_SITE_OTHER): Payer: 59 | Admitting: Family Medicine

## 2017-03-27 ENCOUNTER — Encounter: Payer: Self-pay | Admitting: *Deleted

## 2017-03-27 VITALS — BP 123/77 | HR 94 | Temp 98.8°F | Resp 12 | Ht 64.0 in | Wt 231.0 lb

## 2017-03-27 DIAGNOSIS — E039 Hypothyroidism, unspecified: Secondary | ICD-10-CM | POA: Diagnosis not present

## 2017-03-27 DIAGNOSIS — E538 Deficiency of other specified B group vitamins: Secondary | ICD-10-CM

## 2017-03-27 DIAGNOSIS — E559 Vitamin D deficiency, unspecified: Secondary | ICD-10-CM | POA: Diagnosis not present

## 2017-03-27 DIAGNOSIS — F988 Other specified behavioral and emotional disorders with onset usually occurring in childhood and adolescence: Secondary | ICD-10-CM

## 2017-03-27 DIAGNOSIS — F418 Other specified anxiety disorders: Secondary | ICD-10-CM

## 2017-03-27 LAB — VITAMIN D 25 HYDROXY (VIT D DEFICIENCY, FRACTURES): VITD: 10.17 ng/mL — ABNORMAL LOW (ref 30.00–100.00)

## 2017-03-27 LAB — VITAMIN B12: Vitamin B-12: 241 pg/mL (ref 211–911)

## 2017-03-27 LAB — TSH: TSH: 0.06 u[IU]/mL — ABNORMAL LOW (ref 0.35–4.50)

## 2017-03-27 MED ORDER — CYANOCOBALAMIN 1000 MCG/ML IJ SOLN: 1000.0000 ug | INTRAMUSCULAR | 3 refills | Status: DC

## 2017-03-27 MED ORDER — THYROID 90 MG PO TABS: ORAL_TABLET | ORAL | 2 refills | Status: DC

## 2017-03-27 MED ORDER — FLUOXETINE HCL 20 MG PO CAPS: 20.0000 mg | ORAL_CAPSULE | Freq: Every day | ORAL | 2 refills | Status: DC

## 2017-03-27 MED ORDER — METHYLPHENIDATE HCL ER (LA) 20 MG PO CP24: 20.0000 mg | ORAL_CAPSULE | ORAL | 0 refills | Status: DC

## 2017-03-27 NOTE — Assessment & Plan Note (Signed)
Not compliant with vit D supplementation. Further recommendations will be given according to lab results.

## 2017-03-27 NOTE — Patient Instructions (Addendum)
A few things to remember from today's visit:   Attention deficit disorder, unspecified hyperactivity presence - Plan: methylphenidate (RITALIN LA) 20 MG 24 hr capsule, DISCONTINUED: methylphenidate (RITALIN LA) 20 MG 24 hr capsule  Other specified anxiety disorders - Plan: FLUoxetine (PROZAC) 20 MG capsule  Abnormal thyroid stimulating hormone (TSH) level - Plan: TSH  Primary hypothyroidism - Plan: thyroid (ARMOUR THYROID) 90 MG tablet  B12 deficiency - Plan: Vitamin B12  Vitamin D deficiency - Plan: VITAMIN D 25 Hydroxy (Vit-D Deficiency, Fractures)  Prozac to start again,please let me know in 6-8 weeks how your are doing with this medication.  No changes in rest.   Please be sure medication list is accurate. If a new problem present, please set up appointment sooner than planned today.

## 2017-03-27 NOTE — Assessment & Plan Note (Signed)
Symptoms well controlled, no changes in current management. Medication contract signed today. Side effects of medication reviewed. F/U in 4 months.

## 2017-03-27 NOTE — Assessment & Plan Note (Signed)
No changes in current management, will follow labs done today and will give further recommendations accordingly.  

## 2017-03-27 NOTE — Assessment & Plan Note (Signed)
She has not been consistent with pharmacologic treatment. She would like to try Prozac again,some side effects discussed. She will let me know in 6-8 weeks if medication is helping. Instructed about warning signs. F/U in 4 months.

## 2017-04-04 ENCOUNTER — Other Ambulatory Visit: Payer: Self-pay | Admitting: *Deleted

## 2017-04-04 MED ORDER — VITAMIN D (ERGOCALCIFEROL) 1.25 MG (50000 UNIT) PO CAPS: 50000.0000 [IU] | ORAL_CAPSULE | ORAL | 0 refills | Status: AC

## 2017-05-16 ENCOUNTER — Other Ambulatory Visit: Payer: 59

## 2017-10-24 ENCOUNTER — Other Ambulatory Visit: Payer: Self-pay | Admitting: Family Medicine

## 2017-10-24 DIAGNOSIS — F988 Other specified behavioral and emotional disorders with onset usually occurring in childhood and adolescence: Secondary | ICD-10-CM

## 2017-10-24 NOTE — Telephone Encounter (Signed)
Copied from [place] [id number]. Topic: Quick Communication - Rx Refill/Question >> Oct 24, 2017  4:37 PM Sheppard Coil, [employer] L wrote: Medication: methylphenidate (RITALIN LA) 20 MG 24 hr capsule  Has the patient contacted their pharmacy? No - controlled substance (Agent: If no, request that the patient contact the pharmacy for the refill.) (Agent: If yes, when and what did the pharmacy advise?)  Preferred Pharmacy (with phone number or street name): [facility] [id number] [place] [phone] (Phone) [phone] (Fax)  Agent: Please be advised that RX refills may take up to 3 business days. We ask that you follow-up with your pharmacy.

## 2017-10-24 NOTE — Telephone Encounter (Signed)
Methlyphenidate refill Last Refill:05/22/17 #30 Last OV: 03/27/17 PCP: Dr. Martinique Pharmacy:[facility]

## 2017-10-30 ENCOUNTER — Encounter: Payer: Self-pay | Admitting: Family Medicine

## 2017-10-30 ENCOUNTER — Ambulatory Visit (INDEPENDENT_AMBULATORY_CARE_PROVIDER_SITE_OTHER): Payer: 59 | Admitting: Family Medicine

## 2017-10-30 VITALS — BP 118/68 | HR 78 | Temp 98.5°F | Resp 12 | Ht 64.0 in | Wt 236.0 lb

## 2017-10-30 DIAGNOSIS — E559 Vitamin D deficiency, unspecified: Secondary | ICD-10-CM

## 2017-10-30 DIAGNOSIS — R5383 Other fatigue: Secondary | ICD-10-CM

## 2017-10-30 DIAGNOSIS — F988 Other specified behavioral and emotional disorders with onset usually occurring in childhood and adolescence: Secondary | ICD-10-CM | POA: Diagnosis not present

## 2017-10-30 DIAGNOSIS — R5381 Other malaise: Secondary | ICD-10-CM | POA: Diagnosis not present

## 2017-10-30 DIAGNOSIS — E039 Hypothyroidism, unspecified: Secondary | ICD-10-CM

## 2017-10-30 DIAGNOSIS — W57XXXS Bitten or stung by nonvenomous insect and other nonvenomous arthropods, sequela: Secondary | ICD-10-CM

## 2017-10-30 DIAGNOSIS — E538 Deficiency of other specified B group vitamins: Secondary | ICD-10-CM | POA: Diagnosis not present

## 2017-10-30 DIAGNOSIS — Z6837 Body mass index (BMI) 37.0-37.9, adult: Secondary | ICD-10-CM

## 2017-10-30 LAB — BASIC METABOLIC PANEL
BUN: 8 mg/dL (ref 6–23)
CALCIUM: 9.3 mg/dL (ref 8.4–10.5)
CO2: 30 mEq/L (ref 19–32)
Chloride: 101 mEq/L (ref 96–112)
Creatinine, Ser: 0.73 mg/dL (ref 0.40–1.20)
GFR: 94.08 mL/min (ref >60.00)
Glucose, Bld: 91 mg/dL (ref 70–99)
POTASSIUM: 4 meq/L (ref 3.5–5.1)
SODIUM: 137 meq/L (ref 135–145)

## 2017-10-30 LAB — CBC
HCT: 39.3 % (ref 36.0–46.0)
HEMOGLOBIN: 13.2 g/dL (ref 12.0–15.0)
MCHC: 33.7 g/dL (ref 30.0–36.0)
MCV: 87.2 fl (ref 78.0–100.0)
PLATELETS: 243 10*3/uL (ref 150.0–400.0)
RBC: 4.5 Mil/uL (ref 3.87–5.11)
RDW: 14.8 % (ref 11.5–15.5)
WBC: 8.3 10*3/uL (ref 4.0–10.5)

## 2017-10-30 LAB — VITAMIN B12: VITAMIN B 12: 201 pg/mL — AB (ref 211–911)

## 2017-10-30 LAB — TSH: TSH: 3.43 u[IU]/mL (ref 0.35–4.50)

## 2017-10-30 LAB — VITAMIN D 25 HYDROXY (VIT D DEFICIENCY, FRACTURES): VITD: 12.49 ng/mL — AB (ref 30.00–100.00)

## 2017-10-30 LAB — SEDIMENTATION RATE: Sed Rate: 89 mm/hr — ABNORMAL HIGH (ref 0–20)

## 2017-10-30 LAB — C-REACTIVE PROTEIN: CRP: 0.8 mg/dL (ref 0.5–20.0)
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
## 2017-10-30 NOTE — Patient Instructions (Addendum)
A few things to remember from today's visit:   Vitamin D deficiency - Plan: VITAMIN D 25 Hydroxy (Vit-D Deficiency, Fractures)  Attention deficit disorder, unspecified hyperactivity presence  B12 deficiency - Plan: Vitamin B12  Malaise and fatigue - Plan: Basic metabolic panel, C-reactive protein, Sedimentation rate, B. burgdorfi antibodies  Primary hypothyroidism - Plan: CBC, TSH  Tick bite, sequela - Plan: B. burgdorfi antibodies  It is a common symptom associated with multiple factors: psychologic,medications, systemic illness, sleep disorders,infections, and unknown causes. Some work-up can be done to evaluate for common causes as thyroid disease,anemia,diabetes, or abnormalities in calcium,potassium,or sodium. Regular physical activity as tolerated and a healthy diet is usually might help and usually recommended for chronic fatigue.  ?  Fibromyalgia. Cymbalta is an option if labs are normal. Regular physical activity and good sleep hygiene.  Please be sure medication list is accurate. If a new problem present, please set up appointment sooner than planned today.

## 2017-10-30 NOTE — Assessment & Plan Note (Signed)
It was abnormal in 03/2017, she did not follow-up as recommended. No changes in dose of Armour Thyroid, dose will be adjusted according to TSH results.

## 2017-10-30 NOTE — Assessment & Plan Note (Signed)
Ritalin LA 20 mg is helping. With discussed some side effects. No changes in current management. Follow-up in 6 months, before if needed.

## 2017-10-30 NOTE — Assessment & Plan Note (Signed)
No changes in current management, will follow labs done today and will give further recommendations accordingly.  

## 2017-10-30 NOTE — Assessment & Plan Note (Signed)
Gaining weight steadily. Since her last visit in 03/2017 she has gained about 5 pounds.  We discussed benefits of wt loss as well as adverse effects of obesity. Consistency with healthy diet and physical activity recommended. Brisk walking for 15-30 min as tolerated.

## 2017-10-30 NOTE — Assessment & Plan Note (Signed)
Further recommendations will be given according to 25 OH vitamin D results.

## 2017-10-31 ENCOUNTER — Telehealth: Payer: Self-pay | Admitting: Family Medicine

## 2017-10-31 ENCOUNTER — Encounter: Payer: Self-pay | Admitting: Family Medicine

## 2017-10-31 MED ORDER — VITAMIN D (ERGOCALCIFEROL) 1.25 MG (50000 UNIT) PO CAPS: 50000.0000 [IU] | ORAL_CAPSULE | ORAL | 0 refills | Status: AC

## 2017-10-31 MED ORDER — THYROID 90 MG PO TABS: ORAL_TABLET | ORAL | 3 refills | Status: DC

## 2017-10-31 NOTE — Telephone Encounter (Signed)
Copied from [place] [id number]. Topic: General - Other >> Oct 31, 2017  3:09 PM Cecelia Byars, NT wrote: Reason for CRM:The patient called and said she was suppose to generic ritalin unsure of mgs , and it was being sent to the [facility] # [address] [place] [phone] (Phone) [phone] (Fax) Please call her at  [phone] if there are any questions

## 2017-10-31 NOTE — Telephone Encounter (Signed)
Pharmacist can change prescription to either generic or brand name.Usually generic is filled when there is one without specific authorization.  I do not need to intervene when brand-name is requested.   Thanks, BJ

## 2017-10-31 NOTE — Telephone Encounter (Signed)
Message sent to Dr. Jordan for review and approval. 

## 2017-11-01 ENCOUNTER — Telehealth: Payer: Self-pay | Admitting: Family Medicine

## 2017-11-01 MED ORDER — METHYLPHENIDATE HCL ER (LA) 20 MG PO CP24: 20.0000 mg | ORAL_CAPSULE | ORAL | 0 refills | Status: DC

## 2017-11-01 NOTE — Telephone Encounter (Signed)
Copied from [place] [id number]. Topic: Inquiry >> Nov 01, 2017  4:17 PM Margot Ables wrote: Reason for CRM: pt is having trouble getting into mychart and would like nurse to contact her. I read part of the msg to her and she would like additional explanation.

## 2017-11-01 NOTE — Telephone Encounter (Signed)
Rx for Ritalin LA 20 mg x 3 sent to [facility]. Thanks, BJ

## 2017-11-01 NOTE — Addendum Note (Signed)
Addended by: Martinique, BETTY G on: 11/01/2017 05:30 PM   Modules accepted: Orders

## 2017-11-01 NOTE — Telephone Encounter (Signed)
Spoke with pharmacy and they stated that they Rx needed to be e-scribed or hard copy given to patient. Need Rx for Ritalin LA 20 mg 24 hr capsule.

## 2017-11-03 ENCOUNTER — Ambulatory Visit (INDEPENDENT_AMBULATORY_CARE_PROVIDER_SITE_OTHER): Payer: 59 | Admitting: Adult Health

## 2017-11-03 ENCOUNTER — Encounter: Payer: Self-pay | Admitting: Adult Health

## 2017-11-03 VITALS — BP 100/74 | HR 80 | Temp 99.0°F | Ht 64.0 in | Wt 236.0 lb

## 2017-11-03 DIAGNOSIS — S80861A Insect bite (nonvenomous), right lower leg, initial encounter: Secondary | ICD-10-CM | POA: Diagnosis not present

## 2017-11-03 DIAGNOSIS — W57XXXA Bitten or stung by nonvenomous insect and other nonvenomous arthropods, initial encounter: Secondary | ICD-10-CM | POA: Diagnosis not present
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
## 2017-11-14 ENCOUNTER — Telehealth: Payer: Self-pay | Admitting: Family Medicine

## 2017-11-14 NOTE — Telephone Encounter (Signed)
Patient's mother, Vaughan Basta, calling to schedule flu shot and check the status of approval for quantiferon gold. Scheduled for flu shot on 11/21/17. Would like a call to to schedule quantiferon gold once Dr Martinique advises.

## 2017-11-14 NOTE — Telephone Encounter (Signed)
Message sent to Dr. Jordan for review and approval. 

## 2017-11-14 NOTE — Telephone Encounter (Signed)
Copied from [place] [id number]. Topic: General - Other >> Nov 14, 2017 10:38 AM Cecelia Byars, NT wrote: Reason for CRM: Patient called and would like to have a quantiferon test  for school as  soon as ,  please call her at  [phone]

## 2017-11-15 ENCOUNTER — Other Ambulatory Visit: Payer: Self-pay | Admitting: *Deleted

## 2017-11-15 ENCOUNTER — Other Ambulatory Visit (INDEPENDENT_AMBULATORY_CARE_PROVIDER_SITE_OTHER): Payer: 59

## 2017-11-15 DIAGNOSIS — Z111 Encounter for screening for respiratory tuberculosis: Secondary | ICD-10-CM | POA: Diagnosis not present

## 2017-11-15 DIAGNOSIS — Z23 Encounter for immunization: Secondary | ICD-10-CM

## 2017-11-15 NOTE — Telephone Encounter (Signed)
Spoke with patient and scheduled her for the lab today because patient stated that she needed it done this week.

## 2017-11-15 NOTE — Telephone Encounter (Signed)
Reason for CRM:  Patient's mother, Ariatna Jester, stated that the patient needs a flu shot and a quanti feron test this week. Please call with appt. 11/15/2017 09:40 AM Phone (Incoming) [place], [place] (Mother) [phone] (M)

## 2017-11-17 LAB — QUANTIFERON-TB GOLD PLUS
Mitogen-NIL: >10 IU/mL
NIL: 0.03 IU/mL
QUANTIFERON-TB GOLD PLUS: NEGATIVE
TB1-NIL: 0 [IU]/mL
TB2-NIL: 0 IU/mL

## 2017-11-20 ENCOUNTER — Encounter: Payer: Self-pay | Admitting: Family Medicine

## 2017-11-21 ENCOUNTER — Ambulatory Visit: Payer: 59

## 2017-11-21 ENCOUNTER — Telehealth: Payer: Self-pay

## 2017-11-21 NOTE — Telephone Encounter (Signed)
Copied from [place] [id number]. Topic: General - Other >> Nov 21, 2017 11:29 AM Judyann Munson wrote: Reason for CRM:  Patient is calling to request a paper copy of her flu shot and TB screening, please advise

## 2017-11-22 NOTE — Telephone Encounter (Signed)
Patient informed that her copies of her Flu shot and TB screening would be at the front desk for pick up. Patient verbalized understanding.

## 2017-12-27 ENCOUNTER — Other Ambulatory Visit: Payer: Self-pay | Admitting: Family Medicine

## 2017-12-27 DIAGNOSIS — F988 Other specified behavioral and emotional disorders with onset usually occurring in childhood and adolescence: Secondary | ICD-10-CM

## 2017-12-27 NOTE — Telephone Encounter (Signed)
Requested medication (s) are due for refill today: yes  Requested medication (s) are on the active medication list: yes  Last refill:  Pt states she has lost her last written rx and is requesting a refill as soon as possible due to school schedule  Future visit scheduled: no      Requested Prescriptions  Pending Prescriptions Disp Refills   methylphenidate (RITALIN LA) 20 MG 24 hr capsule 30 capsule 0    Sig: Take 1 capsule (20 mg total) by mouth every morning.     Not Delegated - Psychiatry:  Stimulants/ADHD Failed - 12/27/2017  5:46 PM      Failed - This refill cannot be delegated      Failed - Urine Drug Screen completed in last 360 days.      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

## 2017-12-27 NOTE — Telephone Encounter (Signed)
Copied from [place] [id number]. Topic: Quick Communication - See Telephone Encounter >> Dec 27, 2017 10:43 AM Marja Kays F wrote: Pt has lost her last written rx for the next refill on her ritalin and she is needing to get this filled as soon as possible due to her school schedule  Best number [place]

## 2017-12-27 NOTE — Telephone Encounter (Signed)
Pt has lost her last written rx for the next refill on her ritalin and she is needing to get this filled as soon as possible due to her school schedule.  Best number [phone]

## 2017-12-28 NOTE — Telephone Encounter (Signed)
Taylor Yates mother called to f/u Ph # [phone]  Advised her that Dr. Martinique is out of office today. She is requesting call back to let her know if approved or not so she can arrange to pick up RX at the pharmacy.  [facility] # [place], [place]

## 2017-12-28 NOTE — Telephone Encounter (Signed)
Message sent to Dr. Jordan for review and approval. 

## 2017-12-28 NOTE — Telephone Encounter (Signed)
Message routed to PCP CMA for refills. 

## 2017-12-29 NOTE — Telephone Encounter (Signed)
Pending approval from Dr. Jordan. 

## 2017-12-29 NOTE — Telephone Encounter (Signed)
It is supposed to be a third prescription for Ritalin LA at her pharmacy to be filled 01/01/2018. Thanks, BJ

## 2017-12-29 NOTE — Telephone Encounter (Signed)
Called pharmacy and was told that patient had not called for refill and they still had 2 Rx's that could be filled. Confirmed that it was okay to refill. Patient called and informed that Rx can be picked up at the pharmacy.

## 2018-03-08 ENCOUNTER — Telehealth: Payer: Self-pay | Admitting: Family Medicine

## 2018-03-08 DIAGNOSIS — F988 Other specified behavioral and emotional disorders with onset usually occurring in childhood and adolescence: Secondary | ICD-10-CM

## 2018-03-08 NOTE — Telephone Encounter (Signed)
Copied from [place] [id number]. Topic: Quick Communication - Rx Refill/Question >> Mar 08, 2018  3:51 PM Leward Quan A wrote: Medication: methylphenidate (RITALIN LA) 20 MG 24 hr capsule  Has the patient contacted their pharmacy? Yes.   (Agent: If no, request that the patient contact the pharmacy for the refill.) (Agent: If yes, when and what did the pharmacy advise?)  Preferred Pharmacy (with phone number or street name): [facility] # [id number], [place] [phone] (Phone) [phone] (Fax)    Agent: Please be advised that RX refills may take up to 3 business days. We ask that you follow-up with your pharmacy.

## 2018-03-14 NOTE — Telephone Encounter (Signed)
Patient's mother, Vaughan Basta, calling and states the patient was unaware of needing to make an appointment to get a refill. States that the patient just started a new job and is needing this medication. Advised that the patient would need to call and schedule an appointment at her convenience. Would like to know if she could get the refill and then have her come in for an appointment? CB#: [phone]

## 2018-03-15 NOTE — Telephone Encounter (Signed)
Pt's mother Vaughan Basta called again to follow up on Dr. Doug Sou decision. Advised her that Dr. Martinique is OOO today. She would like a callback when Dr. Martinique is back in the office. [phone]

## 2018-03-16 ENCOUNTER — Ambulatory Visit (INDEPENDENT_AMBULATORY_CARE_PROVIDER_SITE_OTHER): Payer: 59 | Admitting: Family Medicine

## 2018-03-16 ENCOUNTER — Encounter: Payer: Self-pay | Admitting: Family Medicine

## 2018-03-16 VITALS — BP 100/70 | HR 80 | Temp 98.2°F | Resp 12 | Ht 64.0 in | Wt 238.4 lb

## 2018-03-16 DIAGNOSIS — E559 Vitamin D deficiency, unspecified: Secondary | ICD-10-CM | POA: Diagnosis not present

## 2018-03-16 DIAGNOSIS — F988 Other specified behavioral and emotional disorders with onset usually occurring in childhood and adolescence: Secondary | ICD-10-CM

## 2018-03-16 DIAGNOSIS — F411 Generalized anxiety disorder: Secondary | ICD-10-CM | POA: Diagnosis not present

## 2018-03-16 DIAGNOSIS — Z6837 Body mass index (BMI) 37.0-37.9, adult: Secondary | ICD-10-CM

## 2018-03-16 DIAGNOSIS — R200 Anesthesia of skin: Secondary | ICD-10-CM

## 2018-03-16 DIAGNOSIS — E538 Deficiency of other specified B group vitamins: Secondary | ICD-10-CM | POA: Diagnosis not present

## 2018-03-16 MED ORDER — METHYLPHENIDATE HCL 10 MG PO TABS: 10.0000 mg | ORAL_TABLET | Freq: Every day | ORAL | 0 refills | Status: DC

## 2018-03-16 MED ORDER — METHYLPHENIDATE HCL ER (LA) 30 MG PO CP24: 30.0000 mg | ORAL_CAPSULE | ORAL | 0 refills | Status: DC

## 2018-03-16 NOTE — Assessment & Plan Note (Signed)
She feels like medication needs to be adjusted. Recommend increasing Ritalin LA from 20 mg to 30 mg. Ritalin 10 mg to take early afternoon if needed. We discussed side effects of medications. Follow-up in 2 months, before if needed.
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
## 2018-03-16 NOTE — Patient Instructions (Addendum)
A few things to remember from today's visit:   Attention deficit disorder, unspecified hyperactivity presence - Plan: methylphenidate (RITALIN LA) 30 MG 24 hr capsule, methylphenidate (RITALIN) 10 MG tablet  B12 deficiency  Vitamin D deficiency  Generalized anxiety disorder  Numbness of left hand  Left hand numbness seems to be caused by carpal tunnel syndrome. Recommend waiting a splint at night.  Ritalin increased from 20 mg to 30 mg and a short acting daily and 10 mg to take in the early afternoon if needed.  Please be sure medication list is accurate. If a new problem present, please set up appointment sooner than planned today.

## 2018-03-16 NOTE — Telephone Encounter (Signed)
Rx filled on 03/16/2018 at [facility].

## 2018-03-16 NOTE — Assessment & Plan Note (Signed)
Adverse effects of low vitamin B-12 discussed. She can try B12 1000 mcg sublingual. We will plan on checking next visit.

## 2018-03-16 NOTE — Assessment & Plan Note (Signed)
She is not interested in trying pharmacologic treatment. She feels like she can manage anxiety without medication for now.

## 2018-03-16 NOTE — Assessment & Plan Note (Signed)
She has not been compliant with vitamin D supplementation, so lab was not done today. Recommend taking OTC vitamin D 5000 units daily. We will plan on checking 25 OH vitamin D next visit.

## 2018-03-16 NOTE — Telephone Encounter (Signed)
Message sent to Dr. Jordan for review and approval. Please advise. 

## 2018-03-16 NOTE — Assessment & Plan Note (Signed)
We discussed benefits of wt loss as well as adverse effects of obesity. Consistency with healthy diet and physical activity recommended.  

## 2018-04-11 ENCOUNTER — Telehealth: Payer: Self-pay | Admitting: Family Medicine

## 2018-04-11 NOTE — Telephone Encounter (Signed)
Medication not delegated to NT to refill  

## 2018-04-11 NOTE — Telephone Encounter (Signed)
Copied from [place] [id number]. Topic: Quick Communication - Rx Refill/Question >> Apr 11, 2018 10:10 AM Burchel, Abbi R wrote: Medication: methylphenidate (RITALIN LA) 30 MG 24 hr capsule,  methylphenidate (RITALIN) 10 MG tablet   Preferred Pharmacy: [facility] # [address], [place]  [phone] (Phone) [phone] (Fax)   Pt was advised that RX refills may take up to 3 business days. We ask that you follow-up with your pharmacy.

## 2018-04-11 NOTE — Telephone Encounter (Signed)
Message sent to Dr. Jordan for review and approval. 

## 2018-04-12 NOTE — Telephone Encounter (Signed)
Patient's mother called to check the status of her refill.  If there are any questions, please call Delonda Coley at [phone]

## 2018-04-13 ENCOUNTER — Other Ambulatory Visit: Payer: Self-pay | Admitting: Family Medicine

## 2018-04-13 DIAGNOSIS — F988 Other specified behavioral and emotional disorders with onset usually occurring in childhood and adolescence: Secondary | ICD-10-CM

## 2018-04-13 MED ORDER — METHYLPHENIDATE HCL ER (LA) 30 MG PO CP24: 30.0000 mg | ORAL_CAPSULE | ORAL | 0 refills | Status: DC

## 2018-04-13 MED ORDER — METHYLPHENIDATE HCL 10 MG PO TABS: 10.0000 mg | ORAL_TABLET | Freq: Every day | ORAL | 0 refills | Status: DC

## 2018-04-13 NOTE — Telephone Encounter (Signed)
Ritalin last refill 03/16/2018. Prescriptions sent to her pharmacy.  Thanks, BJ

## 2018-06-07 ENCOUNTER — Telehealth: Payer: Self-pay | Admitting: Family Medicine

## 2018-06-07 NOTE — Telephone Encounter (Signed)
Copied from [place] [id number]. Topic: Quick Communication - See Telephone Encounter >> Jun 07, 2018 11:10 AM Loma Boston wrote: CRM for notification. See Telephone encounter for: 06/07/18.methylphenidate (RITALIN LA) 30 MG 24 hr capsule  [facility] # [address] [place] [phone] (Phone) [phone] (Fax) Refill

## 2018-06-07 NOTE — Telephone Encounter (Signed)
Patient scheduled for follow-up for tomorrow.

## 2018-06-08 ENCOUNTER — Encounter: Payer: Self-pay | Admitting: Family Medicine

## 2018-06-08 ENCOUNTER — Other Ambulatory Visit: Payer: Self-pay

## 2018-06-08 ENCOUNTER — Ambulatory Visit (INDEPENDENT_AMBULATORY_CARE_PROVIDER_SITE_OTHER): Payer: BC Managed Care – PPO | Admitting: Family Medicine

## 2018-06-08 DIAGNOSIS — E538 Deficiency of other specified B group vitamins: Secondary | ICD-10-CM

## 2018-06-08 DIAGNOSIS — F988 Other specified behavioral and emotional disorders with onset usually occurring in childhood and adolescence: Secondary | ICD-10-CM | POA: Diagnosis not present

## 2018-06-08 DIAGNOSIS — K580 Irritable bowel syndrome with diarrhea: Secondary | ICD-10-CM | POA: Diagnosis not present

## 2018-06-08 NOTE — Assessment & Plan Note (Signed)
She has not been compliant with B12 supplementation. Recommend OTC B12 1000 mcg SL daily.

## 2018-06-08 NOTE — Telephone Encounter (Signed)
She is supposed to have 2 prescriptions available at her pharmacy. Last prescription for Ritalin fill on 05/07/18 (immediate release) and 04/13/18 (LA), she has not filled Rx for 05/15/18.  Please verify with pharmacy. Thanks, BJ

## 2018-06-08 NOTE — Assessment & Plan Note (Signed)
Problem is well controlled with current management. We discussed some side effects of Ritalin. No changes in current management. Follow-up in 5 months, before if needed.

## 2018-06-08 NOTE — Assessment & Plan Note (Addendum)
GI symptoms she is reporting today could be related with this problem. ?  Postcholecystectomy syndrome,Whechol could be considered in the future if needed.  For now I recommend trying Levsin 0.125 mg 3-4 times per day as needed, she has medication at home. She has follow-up with GI in the past, recommend arrange an appointment if symptoms do start getting worse. Instructed about warning signs.
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
## 2018-06-11 NOTE — Telephone Encounter (Signed)
Spoke with the pharmacy and patient does have 05/15/2018 Rx's for both left at pharmacy to refill. Left detailed message for patient to contact pharmacy for refills.

## 2018-07-05 ENCOUNTER — Telehealth: Payer: Self-pay | Admitting: Family Medicine

## 2018-07-05 NOTE — Telephone Encounter (Signed)
Copied from [place] [id number]. Topic: Quick Communication - See Telephone Encounter >> Jul 05, 2018  5:52 PM Blase Mess A wrote: CRM for notification. See Telephone encounter for: 07/05/18.Patient's mother is calling regarding methylphenidate (RITALIN LA) 30 MG 24 hr capsule [[id number]] . Requesting a PA. The patient has a new Copywriter, advertising.  The patient paid full price for the medication  Please advise- [phone]  The patient is requesting a call back when this is approved. Vaughan Basta [phone]

## 2018-07-09 ENCOUNTER — Telehealth: Payer: Self-pay | Admitting: *Deleted

## 2018-07-09 NOTE — Telephone Encounter (Signed)
Copied from [place] [id number]. Topic: Quick Communication - See Telephone Encounter >> Jul 05, 2018  5:52 PM Blase Mess A wrote: CRM for notification. See Telephone encounter for: 07/05/18.  Patient's mother is calling regarding methylphenidate (RITALIN LA) 30 MG 24 hr capsule [[id number]] . Requesting a PA. The patient has a new Copywriter, advertising.  The patient paid full price for the medication  Please advise- [phone] >> Jul 06, 2018  5:47 PM Mcneil, Jacinto Reap wrote: Pt mother called in to follow up on the prior authorization for the medication that they paid full price for. Pt mother said they only have a few more days to get this taken care of to be able to receive reimbursement. Pt mother stated she will call back on Monday morning

## 2018-07-09 NOTE — Telephone Encounter (Signed)
Pt's mother calling.  States that it is urgent to get this done.  States that they already picked up medication and they only have a 7 day window for the retroactive prior authorization to be placed. Pt's mother would like a call today.

## 2018-07-09 NOTE — Telephone Encounter (Signed)
PA has been sent to cover my meds.   Key: [id number] - Rx #: [id number]

## 2018-07-09 NOTE — Telephone Encounter (Signed)
PA needed

## 2018-07-11 NOTE — Telephone Encounter (Signed)
Left message for patient to call back. CRM created 

## 2018-07-11 NOTE — Telephone Encounter (Signed)
Called and spoke to [employer], the PA is in review and it has been marked as urgent.

## 2018-07-11 NOTE — Telephone Encounter (Signed)
Pt mother calling and states that her insurance have not received a PA. Pt mother would like a call back because this is time sensitive. Please advise

## 2018-07-12 NOTE — Telephone Encounter (Signed)
Pt and Pts mom called stating their thanks for [employer]'s help with this PA.

## 2018-07-23 NOTE — Telephone Encounter (Signed)
Pa has been approved. Patient is aware.

## 2018-08-14 ENCOUNTER — Other Ambulatory Visit: Payer: Self-pay | Admitting: Family Medicine

## 2018-08-14 DIAGNOSIS — F988 Other specified behavioral and emotional disorders with onset usually occurring in childhood and adolescence: Secondary | ICD-10-CM

## 2018-09-24 ENCOUNTER — Telehealth: Payer: Self-pay | Admitting: Family Medicine

## 2018-09-24 DIAGNOSIS — E039 Hypothyroidism, unspecified: Secondary | ICD-10-CM

## 2018-09-24 MED ORDER — THYROID 90 MG PO TABS: ORAL_TABLET | ORAL | 3 refills | Status: DC

## 2018-09-24 NOTE — Telephone Encounter (Signed)
Medication Refill - Medication:   ranitidine (ZANTAC) 150 MG tablet    thyroid (ARMOUR THYROID) 90 MG tablet      Has the patient contacted their pharmacy? Yes.   (Agent: If no, request that the patient contact the pharmacy for the refill.) (Agent: If yes, when and what did the pharmacy advise?)  Preferred Pharmacy (with phone number or street name):  [facility] # [address] [place] [phone] (Phone) [phone] (Fax)   Patient needs prior auth on methylphenidate (RITALIN) 10 MG tablet.   Agent: Please be advised that RX refills may take up to 3 business days. We ask that you follow-up with your pharmacy.

## 2018-09-24 NOTE — Telephone Encounter (Signed)
Please advise on refill request

## 2018-09-24 NOTE — Telephone Encounter (Signed)
See request for prior authorization for Ritalin and Zantac request.

## 2018-09-26 NOTE — Telephone Encounter (Signed)
Message sent to Dr. Jordan for review and approval. 

## 2018-09-28 ENCOUNTER — Other Ambulatory Visit: Payer: Self-pay | Admitting: Family Medicine

## 2018-09-28 DIAGNOSIS — E039 Hypothyroidism, unspecified: Secondary | ICD-10-CM

## 2018-09-28 DIAGNOSIS — F988 Other specified behavioral and emotional disorders with onset usually occurring in childhood and adolescence: Secondary | ICD-10-CM

## 2018-09-28 MED ORDER — THYROID 90 MG PO TABS: ORAL_TABLET | ORAL | 3 refills | Status: DC

## 2018-09-28 NOTE — Telephone Encounter (Signed)
Spoke with patient and informed her that thyroid medication was sent to the pharmacy. Patient stated that she did not need a refill on Zantac. Patient informed that PA was initiated for Ritalin and office would let her know the decision as soon as we know.  Nothing further needed at this time.

## 2018-09-28 NOTE — Telephone Encounter (Signed)
Armour Thyroid 90 mg was sent to her pharmacy. Please explained that the Zantac is no longer in the market.  She could take Pepcid 20 mg once or twice per day. Also let her know that she will need a PA for Ritalin 10 mg. Thanks, BJ

## 2018-09-28 NOTE — Telephone Encounter (Signed)
Pt's mother called investigating status for the refill of both prescriptions. Please advise

## 2018-10-03 ENCOUNTER — Other Ambulatory Visit: Payer: Self-pay | Admitting: Family Medicine

## 2018-10-03 DIAGNOSIS — F988 Other specified behavioral and emotional disorders with onset usually occurring in childhood and adolescence: Secondary | ICD-10-CM

## 2018-10-03 NOTE — Telephone Encounter (Signed)
PTs mother called regarding the refill on methylphenidate (RITALIN) 10 MG tablet . Pts mother states the other prescription for ritalin was sent in, but this one was not. Please advise.     [facility] [address], [place]  [place] [address]  Phone: [phone] Fax: [phone]  Not a 24 hour pharmacy; exact hours not known.

## 2018-10-05 NOTE — Telephone Encounter (Signed)
Patient's mother is requesting a refill for methylphenidate (RITALIN LA) 30 MG 24 hr capsule [id number]   Preferred Pharmacy-[facility] [phone]

## 2018-10-05 NOTE — Telephone Encounter (Signed)
PA approved for Ritalin LA 10 mg. Patient requesting refill of Ritalin 30 mg

## 2018-10-08 ENCOUNTER — Other Ambulatory Visit: Payer: Self-pay | Admitting: Family Medicine

## 2018-10-08 DIAGNOSIS — F988 Other specified behavioral and emotional disorders with onset usually occurring in childhood and adolescence: Secondary | ICD-10-CM

## 2018-10-08 MED ORDER — METHYLPHENIDATE HCL ER (LA) 30 MG PO CP24: 30.0000 mg | ORAL_CAPSULE | ORAL | 0 refills | Status: DC

## 2018-10-08 MED ORDER — METHYLPHENIDATE HCL 10 MG PO TABS: 10.0000 mg | ORAL_TABLET | Freq: Every day | ORAL | 0 refills | Status: DC

## 2018-10-08 NOTE — Telephone Encounter (Signed)
Rx's sent. ?Thanks, ?BJ ?

## 2018-10-08 NOTE — Addendum Note (Signed)
Addended by: Martinique, Elvin Mccartin G on: 10/08/2018 07:16 AM   Modules accepted: Orders

## 2019-01-08 ENCOUNTER — Encounter: Payer: Self-pay | Admitting: Family Medicine

## 2019-01-10 ENCOUNTER — Other Ambulatory Visit: Payer: Self-pay

## 2019-01-11 ENCOUNTER — Encounter: Payer: Self-pay | Admitting: Gastroenterology

## 2019-01-11 ENCOUNTER — Ambulatory Visit (INDEPENDENT_AMBULATORY_CARE_PROVIDER_SITE_OTHER): Payer: BC Managed Care – PPO | Admitting: Family Medicine

## 2019-01-11 ENCOUNTER — Encounter: Payer: Self-pay | Admitting: Family Medicine

## 2019-01-11 ENCOUNTER — Ambulatory Visit (INDEPENDENT_AMBULATORY_CARE_PROVIDER_SITE_OTHER): Payer: BC Managed Care – PPO

## 2019-01-11 VITALS — BP 138/78 | HR 84 | Temp 97.9°F | Wt 229.7 lb

## 2019-01-11 DIAGNOSIS — L678 Other hair color and hair shaft abnormalities: Secondary | ICD-10-CM

## 2019-01-11 DIAGNOSIS — M79672 Pain in left foot: Secondary | ICD-10-CM

## 2019-01-11 DIAGNOSIS — Z131 Encounter for screening for diabetes mellitus: Secondary | ICD-10-CM

## 2019-01-11 DIAGNOSIS — Z13220 Encounter for screening for lipoid disorders: Secondary | ICD-10-CM

## 2019-01-11 DIAGNOSIS — E559 Vitamin D deficiency, unspecified: Secondary | ICD-10-CM

## 2019-01-11 DIAGNOSIS — K2100 Gastro-esophageal reflux disease with esophagitis, without bleeding: Secondary | ICD-10-CM

## 2019-01-11 DIAGNOSIS — R002 Palpitations: Secondary | ICD-10-CM | POA: Diagnosis not present

## 2019-01-11 DIAGNOSIS — Z862 Personal history of diseases of the blood and blood-forming organs and certain disorders involving the immune mechanism: Secondary | ICD-10-CM

## 2019-01-11 DIAGNOSIS — K589 Irritable bowel syndrome without diarrhea: Secondary | ICD-10-CM

## 2019-01-11 DIAGNOSIS — E538 Deficiency of other specified B group vitamins: Secondary | ICD-10-CM

## 2019-01-11 DIAGNOSIS — K921 Melena: Secondary | ICD-10-CM

## 2019-01-11 LAB — CBC WITH DIFFERENTIAL/PLATELET
Basophils Absolute: 0.1 10*3/uL (ref 0.0–0.1)
Basophils Relative: 0.8 % (ref 0.0–3.0)
Eosinophils Absolute: 0.2 10*3/uL (ref 0.0–0.7)
Eosinophils Relative: 2.8 % (ref 0.0–5.0)
HCT: 40.1 % (ref 36.0–46.0)
Hemoglobin: 13.4 g/dL (ref 12.0–15.0)
Lymphocytes Relative: 25 % (ref 12.0–46.0)
Lymphs Abs: 1.9 10*3/uL (ref 0.7–4.0)
MCHC: 33.5 g/dL (ref 30.0–36.0)
MCV: 88.4 fl (ref 78.0–100.0)
Monocytes Absolute: 0.5 10*3/uL (ref 0.1–1.0)
Monocytes Relative: 7.2 % (ref 3.0–12.0)
Neutro Abs: 4.8 10*3/uL (ref 1.4–7.7)
Neutrophils Relative %: 64.2 % (ref 43.0–77.0)
Platelets: 226 10*3/uL (ref 150.0–400.0)
RBC: 4.54 Mil/uL (ref 3.87–5.11)
RDW: 15 % (ref 11.5–15.5)
WBC: 7.4 10*3/uL (ref 4.0–10.5)

## 2019-01-11 LAB — COMPREHENSIVE METABOLIC PANEL
ALT: 10 U/L (ref 0–35)
AST: 16 U/L (ref 0–37)
Albumin: 4.1 g/dL (ref 3.5–5.2)
Alkaline Phosphatase: 71 U/L (ref 39–117)
BUN: 8 mg/dL (ref 6–23)
CO2: 27 mEq/L (ref 19–32)
Calcium: 9.1 mg/dL (ref 8.4–10.5)
Chloride: 102 mEq/L (ref 96–112)
Creatinine, Ser: 0.75 mg/dL (ref 0.40–1.20)
GFR: 85.28 mL/min (ref >60.00)
Glucose, Bld: 95 mg/dL (ref 70–99)
Potassium: 4 mEq/L (ref 3.5–5.1)
Sodium: 137 mEq/L (ref 135–145)
Total Bilirubin: 0.7 mg/dL (ref 0.2–1.2)
Total Protein: 7.2 g/dL (ref 6.0–8.3)

## 2019-01-11 LAB — HEMOGLOBIN A1C: Hgb A1c MFr Bld: 5.3 % (ref 4.6–6.5)

## 2019-01-11 LAB — TESTOSTERONE: Testosterone: 41.93 ng/dL — ABNORMAL HIGH (ref 15.00–40.00)

## 2019-01-11 LAB — IBC + FERRITIN
Ferritin: 19 ng/mL (ref 10.0–291.0)
Iron: 50 ug/dL (ref 42–145)
Saturation Ratios: 12.8 % — ABNORMAL LOW (ref 20.0–50.0)
Transferrin: 279 mg/dL (ref 212.0–360.0)

## 2019-01-11 LAB — LIPID PANEL
Cholesterol: 196 mg/dL (ref 0–200)
HDL: 41.9 mg/dL (ref >39.00)
LDL Cholesterol: 136 mg/dL — ABNORMAL HIGH (ref 0–99)
NonHDL: 153.69
Total CHOL/HDL Ratio: 5
Triglycerides: 88 mg/dL (ref 0.0–149.0)
VLDL: 17.6 mg/dL (ref 0.0–40.0)

## 2019-01-11 LAB — VITAMIN B12: Vitamin B-12: 174 pg/mL — ABNORMAL LOW (ref 211–911)

## 2019-01-11 LAB — TSH: TSH: 0.11 u[IU]/mL — ABNORMAL LOW (ref 0.35–4.50)

## 2019-01-11 MED ORDER — HYOSCYAMINE SULFATE 0.125 MG PO TABS: 0.1250 mg | ORAL_TABLET | ORAL | 0 refills | Status: AC | PRN

## 2019-01-11 MED ORDER — DEXILANT 60 MG PO CPDR: 60.0000 mg | DELAYED_RELEASE_CAPSULE | Freq: Every day | ORAL | 1 refills | Status: DC

## 2019-01-11 NOTE — Progress Notes (Signed)
Taylor Yates DOB: 1980-11-29 Encounter date: 01/11/2019  This is a 38 y.o. female who presents with Chief Complaint  Patient presents with  . Acute Visit    poss GERD, L foot pain, poss abn HR    History of present illness: Has had GI problems in past. Has been awhile, but has had colonoscopies and endoscopies. Has had some bloody stools with mucous x 2 now. Used to see GI (Dr. Sharlett Iles) before he retired. Was fine with GI issues for awhile. Went to new GI in 2018. Was avoiding certain foods due to inflammation and GI told her not to avoid foods and then she gained 40lbs.   Constipation used to be normal, but now has gone more to diarrhea, mucous, undigested food, yellow/bile. Does have a lot of lower abdominal pain. Pain starts left lower abd and spreads from there.   Upset about weight gain. Had tick bite as well around time of last GI visit and feels that it contributed to weight gain.   Feeling acid reflux. Used to be on dexilant years ago but heart burn improved with weight loss. Then when weight gained reflux returned.   Sometimes takes the levsin; has rx but it is old.   Just not feeling great; bloated and not well overall. Doesn't feel like she has hemorrhoids. Just thinks related to diarrhea. Best stool has been soft, barely formed.   Throat irritation more noted in last couple of months. Did need esoph stretched when last scoped. Does feel like it is occasionally hard to swallow.   Heart rate has been "special" lately. Was off of ritalin for years. Adderall gave her racing heart beat. Switched to ritalin and seemed to do better with this. This year, around summer feels like she has some racing on occasion. Doesn't feel like it is just anxiety. Feels like it beats fast, hard. When it happens duration varies. Comes at rest/ or with mild activity. One time felt like she had "free air" when it occurred. Lasted a couple of minutes. Not lasted longer. Discomfort radiated around  chest.   Pain now getting worse with heel spur. Gets to point where she can't work. Hasn't seen specialist in past. Suspects heel spur.   Periods are 5-7 days, can get heavy cramping/discomfort. Not as bad as previous. Does have facial, neck hair. Feels this hasn't been addressed in past.   Allergies  Allergen Reactions  . Eggs Or Egg-Derived Products   . Oxycodone-Acetaminophen   . Prednisone    Current Meds  Medication Sig  . cyanocobalamin (,VITAMIN B-12,) 1000 MCG/ML injection Inject 1 mL (1,000 mcg total) into the muscle every 30 (thirty) days.  . hyoscyamine (LEVSIN, ANASPAZ) 0.125 MG tablet Take 0.125 mg by mouth every 4 (four) hours as needed.  . methylphenidate (RITALIN LA) 30 MG 24 hr capsule Take 1 capsule (30 mg total) by mouth every morning.  . methylphenidate (RITALIN) 10 MG tablet Take 1 tablet (10 mg total) by mouth daily.  . Naproxen Sodium ([employer] NAPROXEN SODIUM) 220 MG CAPS Use one tablet day with food for 1-2 weeks  . thyroid (ARMOUR THYROID) 90 MG tablet TAKE 1 TABLET BY MOUTH MONDAY-SATURDAY AND 1/2 TABLET ON SUNDAY.    Review of Systems  Constitutional: Negative for chills, fatigue and fever.  Respiratory: Negative for cough, chest tightness, shortness of breath and wheezing.   Cardiovascular: Negative for chest pain, palpitations and leg swelling.  Gastrointestinal: Positive for abdominal pain, blood in stool, diarrhea, nausea and vomiting (  rare).       Heartburn  Skin:       Complains of increased body hair and chest and has had this for some time.  Never had evaluation for this.    Objective:  BP 138/78 (BP Location: Left Arm)   Pulse 84   Temp 97.9 F (36.6 C) (Temporal)   Wt 229 lb 11.2 oz (104.2 kg)   SpO2 97%   BMI 39.43 kg/m   Weight: 229 lb 11.2 oz (104.2 kg)   BP Readings from Last 3 Encounters:  01/11/19 138/78  03/16/18 100/70  11/03/17 100/74   Wt Readings from Last 3 Encounters:  01/11/19 229 lb 11.2 oz (104.2 kg)  03/16/18 238 lb  6.4 oz (108.1 kg)  11/03/17 236 lb (107 kg)    Physical Exam Constitutional:      General: She is not in acute distress.    Appearance: She is well-developed.  Cardiovascular:     Rate and Rhythm: Normal rate and regular rhythm.     Heart sounds: Normal heart sounds. No murmur. No friction rub.  Pulmonary:     Effort: Pulmonary effort is normal. No respiratory distress.     Breath sounds: Normal breath sounds. No wheezing or rales.  Abdominal:     General: Abdomen is flat. Bowel sounds are normal. There is no distension.     Palpations: There is no mass.     Tenderness: There is no abdominal tenderness. There is no guarding.  Musculoskeletal:     Right lower leg: No edema.     Left lower leg: No edema.  Feet:     Comments: There is some tenderness with posterior palpation of the left heel.  No tenderness with palpation of plantar fascia on pedal aspect.  No other tenderness reproduced on exam. Neurological:     Mental Status: She is alert and oriented to person, place, and time.  Psychiatric:        Behavior: Behavior normal.     Assessment/Plan  1. Palpitations Exam today is normal.  Difficulty with getting all leads to read on EKG even that was repeated 3 times, but no acute changes noted on this.  No previous for comparison but was normal sinus rhythm. - CBC with Differential/Platelet; Future - Comprehensive metabolic panel; Future - TSH; Future - EKG 12-Lead - TSH - Comprehensive metabolic panel - CBC with Differential/Platelet  2. Gastroesophageal reflux disease with esophagitis, unspecified whether hemorrhage She has seen GI in the past for irritable bowels as well as acid reflux and esophageal issues.  We will restart Dexilant which she did well with in the past for acid reflux and have her follow back up with GI. - Ambulatory referral to Gastroenterology  3. Lipid screening - Lipid panel; Future - Lipid panel  4. Vitamin D deficiency Checking blood work  today.  5. Irritable bowel syndrome, unspecified type See above. - hyoscyamine (LEVSIN) 0.125 MG tablet; Take 1 tablet (0.125 mg total) by mouth every 4 (four) hours as needed for cramping.  Dispense: 30 tablet; Refill: 0 - Ambulatory referral to Gastroenterology  6. B12 deficiency - Vitamin B12; Future - Vitamin B12  7. History of anemia - IBC + Ferritin; Future - IBC + Ferritin  8. Abnormal facial hair - DHEA-sulfate; Future - Testosterone; Future - Testosterone - DHEA-sulfate  9. Screening for diabetes mellitus - Hemoglobin A1c; Future - Hemoglobin A1c  10. Pain of left heel - DG Foot Complete Left; Future - DG Foot Complete  Left  11. Blood in stool - Ambulatory referral to Gastroenterology   Return for pending bloodwork.  Patient has had recent full evaluation [facility] and states that she will send back a copy of blood work that was done through their lab.  Micheline Rough, MD

## 2019-01-11 NOTE — Patient Instructions (Addendum)
I will be in touch with you when I see your results.   GI referral was placed.   Dexilant was sent in to pharmacy for you.  Palpitations Palpitations are feelings that your heartbeat is irregular or is faster than normal. It may feel like your heart is fluttering or skipping a beat. Palpitations are usually not a serious problem. They may be caused by many things, including smoking, caffeine, alcohol, stress, and certain medicines or drugs. Most causes of palpitations are not serious. However, some palpitations can be a sign of a serious problem. You may need further tests to rule out serious medical problems. Follow these instructions at home:     Pay attention to any changes in your condition. Take these actions to help manage your symptoms: Eating and drinking  Avoid foods and drinks that may cause palpitations. These may include: ? Caffeinated coffee, tea, soft drinks, diet pills, and energy drinks. ? Chocolate. ? Alcohol. Lifestyle  Take steps to reduce your stress and anxiety. Things that can help you relax include: ? Yoga. ? Mind-body activities, such as deep breathing, meditation, or using words and images to create positive thoughts (guided imagery). ? Physical activity, such as swimming, jogging, or walking. Tell your health care provider if your palpitations increase with activity. If you have chest pain or shortness of breath with activity, do not continue the activity until you are seen by your health care provider. ? Biofeedback. This is a method that helps you learn to use your mind to control things in your body, such as your heartbeat.  Do not use drugs, including cocaine or ecstasy. Do not use marijuana.  Get plenty of rest and sleep. Keep a regular bed time. General instructions  Take over-the-counter and prescription medicines only as told by your health care provider.  Do not use any products that contain nicotine or tobacco, such as cigarettes and e-cigarettes.  If you need help quitting, ask your health care provider.  Keep all follow-up visits as told by your health care provider. This is important. These may include visits for further testing if palpitations do not go away or get worse. Contact a health care provider if you:  Continue to have a fast or irregular heartbeat after 24 hours.  Notice that your palpitations occur more often. Get help right away if you:  Have chest pain or shortness of breath.  Have a severe headache.  Feel dizzy or you faint. Summary  Palpitations are feelings that your heartbeat is irregular or is faster than normal. It may feel like your heart is fluttering or skipping a beat.  Palpitations may be caused by many things, including smoking, caffeine, alcohol, stress, certain medicines, and drugs.  Although most causes of palpitations are not serious, some causes can be a sign of a serious medical problem.  Get help right away if you faint or have chest pain, shortness of breath, a severe headache, or dizziness. This information is not intended to replace advice given to you by your health care provider. Make sure you discuss any questions you have with your health care provider. Document Released: 01/22/2000 Document Revised: 03/08/2017 Document Reviewed: 03/08/2017 Elsevier Patient Education  2020 [employer].

## 2019-01-14 ENCOUNTER — Encounter: Payer: Self-pay | Admitting: Family Medicine

## 2019-01-14 ENCOUNTER — Telehealth: Payer: Self-pay

## 2019-01-14 LAB — DHEA-SULFATE: DHEA-SO4: 161 ug/dL (ref 23–266)

## 2019-01-14 NOTE — Telephone Encounter (Signed)
Copied from [place] [id number]. Topic: General - Inquiry >> Jan 11, 2019  4:49 PM Reyne Dumas L wrote: Reason for CRM:   Pt wants to know if xray results are back. Also wants to know if the doctor ever writes prescriptions for fitbits. >> Jan 14, 2019 10:16 AM Percell Belt A wrote: Pt also would like to know if there is a way that a script could be written for a heart rate monitor?  Dr Inocente Salles told her that she should get and use it.  Please advise  >> Jan 14, 2019 10:13 AM Percell Belt A wrote: Pt called in and would like someone to call her about her lab work that was done on 12/4

## 2019-01-14 NOTE — Telephone Encounter (Signed)
Copied from [place] [id number]. Topic: General - Inquiry >> Jan 14, 2019 12:18 PM Alease Frame wrote: Reason for CRM: Patient mother would like call back from Dr Martinique . She needing Xrays and foot medical records  Please advise  Vaughan Basta [id number]

## 2019-01-14 NOTE — Telephone Encounter (Signed)
See results in which the patient was informed of the results.  Message sent to PCP for Rx for heart monitor.

## 2019-01-14 NOTE — Addendum Note (Signed)
Addended by: Agnes Lawrence on: 01/14/2019 10:27 AM   Modules accepted: Orders

## 2019-01-14 NOTE — Telephone Encounter (Signed)
Please arrange appt with pt to discuss her concerns. Thanks, BJ

## 2019-01-15 ENCOUNTER — Telehealth: Payer: BC Managed Care – PPO | Admitting: Family Medicine

## 2019-01-15 NOTE — Telephone Encounter (Signed)
I called and spoke with pt. She was just needing to know where the referral to podiatry was to. I gave pt the information and she will let us know if they don't hear from them by Thursday/Friday.

## 2019-01-24 ENCOUNTER — Other Ambulatory Visit: Payer: Self-pay

## 2019-01-24 ENCOUNTER — Ambulatory Visit (INDEPENDENT_AMBULATORY_CARE_PROVIDER_SITE_OTHER): Payer: BC Managed Care – PPO

## 2019-01-24 ENCOUNTER — Ambulatory Visit: Payer: BC Managed Care – PPO | Admitting: Podiatry

## 2019-01-24 DIAGNOSIS — M722 Plantar fascial fibromatosis: Secondary | ICD-10-CM

## 2019-01-24 DIAGNOSIS — M2141 Flat foot [pes planus] (acquired), right foot: Secondary | ICD-10-CM

## 2019-01-24 DIAGNOSIS — M2142 Flat foot [pes planus] (acquired), left foot: Secondary | ICD-10-CM

## 2019-01-24 DIAGNOSIS — M7732 Calcaneal spur, left foot: Secondary | ICD-10-CM

## 2019-01-24 MED ORDER — MELOXICAM 15 MG PO TABS: 15.0000 mg | ORAL_TABLET | Freq: Every day | ORAL | 0 refills | Status: DC

## 2019-01-24 NOTE — Patient Instructions (Signed)
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
## 2019-01-29 ENCOUNTER — Ambulatory Visit: Payer: BC Managed Care – PPO | Admitting: Sports Medicine

## 2019-02-12 ENCOUNTER — Other Ambulatory Visit: Payer: Self-pay

## 2019-02-12 ENCOUNTER — Encounter: Payer: Self-pay | Admitting: Family Medicine

## 2019-02-12 ENCOUNTER — Ambulatory Visit (INDEPENDENT_AMBULATORY_CARE_PROVIDER_SITE_OTHER): Payer: BC Managed Care – PPO | Admitting: Family Medicine

## 2019-02-12 VITALS — BP 128/78 | HR 101 | Temp 95.8°F | Resp 12 | Ht 64.0 in | Wt 238.0 lb

## 2019-02-12 DIAGNOSIS — M542 Cervicalgia: Secondary | ICD-10-CM | POA: Diagnosis not present

## 2019-02-12 DIAGNOSIS — R002 Palpitations: Secondary | ICD-10-CM | POA: Diagnosis not present

## 2019-02-12 DIAGNOSIS — E538 Deficiency of other specified B group vitamins: Secondary | ICD-10-CM | POA: Diagnosis not present

## 2019-02-12 DIAGNOSIS — E66812 Obesity, class 2: Secondary | ICD-10-CM

## 2019-02-12 DIAGNOSIS — Z6837 Body mass index (BMI) 37.0-37.9, adult: Secondary | ICD-10-CM

## 2019-02-12 DIAGNOSIS — F988 Other specified behavioral and emotional disorders with onset usually occurring in childhood and adolescence: Secondary | ICD-10-CM

## 2019-02-12 DIAGNOSIS — E039 Hypothyroidism, unspecified: Secondary | ICD-10-CM

## 2019-02-12 MED ORDER — METHYLPHENIDATE HCL ER (LA) 30 MG PO CP24: 30.0000 mg | ORAL_CAPSULE | ORAL | 0 refills | Status: DC

## 2019-02-12 MED ORDER — METHYLPHENIDATE HCL 10 MG PO TABS: ORAL_TABLET | ORAL | 0 refills | Status: DC

## 2019-02-12 MED ORDER — CYANOCOBALAMIN 1000 MCG/ML IJ SOLN
1000.0000 ug | Freq: Once | INTRAMUSCULAR | Status: AC
  Administered 2019-02-12: 1000 ug via INTRAMUSCULAR
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
## 2019-02-12 NOTE — Patient Instructions (Addendum)
A few things to remember from today's visit:   Palpitations - Plan: Ambulatory referral to Cardiology  B12 deficiency  Neck pain on left side - Plan: Ambulatory referral to Physical Therapy  Primary hypothyroidism  Attention deficit disorder, unspecified hyperactivity presence  Fatigue, unspecified type  Decreased thyroid med as instructed. Try to walk briskly 15 min daily. Count calories, 1800 cl per day.  TSH in 6-8 weeks.   Please be sure medication list is accurate. If a new problem present, please set up appointment sooner than planned today.

## 2019-02-14 ENCOUNTER — Encounter: Payer: Self-pay | Admitting: Physical Therapy

## 2019-02-14 ENCOUNTER — Other Ambulatory Visit: Payer: Self-pay

## 2019-02-14 ENCOUNTER — Ambulatory Visit: Payer: BC Managed Care – PPO | Attending: Family Medicine | Admitting: Physical Therapy

## 2019-02-14 DIAGNOSIS — M6281 Muscle weakness (generalized): Secondary | ICD-10-CM | POA: Insufficient documentation

## 2019-02-14 DIAGNOSIS — M542 Cervicalgia: Secondary | ICD-10-CM | POA: Insufficient documentation

## 2019-02-14 DIAGNOSIS — M62838 Other muscle spasm: Secondary | ICD-10-CM | POA: Insufficient documentation

## 2019-02-14 MED ORDER — THYROID 90 MG PO TABS: ORAL_TABLET | ORAL | 0 refills | Status: DC

## 2019-02-14 NOTE — Patient Instructions (Signed)
Access Code: [email or web address]  URL: [email or web address]  Date: 02/14/2019  Prepared by: Ruben Im   Exercises Seated Cervical Retraction - 3 reps - 1 sets - 1x daily - 7x weekly Supine Passive Cervical Retraction - 10 reps - 1 sets - 3x daily - 7x weekly Seated Scapular Retraction - 10 reps - 1 sets - 3x daily - 7x weekly
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
## 2019-02-19 ENCOUNTER — Telehealth: Payer: Self-pay | Admitting: *Deleted

## 2019-02-19 ENCOUNTER — Ambulatory Visit: Payer: BC Managed Care – PPO | Admitting: Podiatry

## 2019-02-19 ENCOUNTER — Other Ambulatory Visit (INDEPENDENT_AMBULATORY_CARE_PROVIDER_SITE_OTHER): Payer: BC Managed Care – PPO

## 2019-02-19 ENCOUNTER — Ambulatory Visit: Payer: BC Managed Care – PPO | Admitting: Gastroenterology

## 2019-02-19 ENCOUNTER — Encounter: Payer: Self-pay | Admitting: Gastroenterology

## 2019-02-19 ENCOUNTER — Other Ambulatory Visit: Payer: Self-pay

## 2019-02-19 ENCOUNTER — Ambulatory Visit: Payer: BC Managed Care – PPO | Admitting: Physical Therapy

## 2019-02-19 ENCOUNTER — Encounter: Payer: Self-pay | Admitting: Podiatry

## 2019-02-19 ENCOUNTER — Ambulatory Visit: Payer: BC Managed Care – PPO | Admitting: Cardiovascular Disease

## 2019-02-19 ENCOUNTER — Ambulatory Visit: Payer: BC Managed Care – PPO | Admitting: Orthotics

## 2019-02-19 ENCOUNTER — Encounter: Payer: Self-pay | Admitting: Cardiovascular Disease

## 2019-02-19 VITALS — BP 100/60 | HR 68 | Ht 63.0 in | Wt 238.0 lb

## 2019-02-19 VITALS — BP 100/60 | HR 60 | Temp 97.6°F | Ht 63.75 in | Wt 238.4 lb

## 2019-02-19 DIAGNOSIS — K588 Other irritable bowel syndrome: Secondary | ICD-10-CM

## 2019-02-19 DIAGNOSIS — M722 Plantar fascial fibromatosis: Secondary | ICD-10-CM | POA: Diagnosis not present

## 2019-02-19 DIAGNOSIS — R194 Change in bowel habit: Secondary | ICD-10-CM

## 2019-02-19 DIAGNOSIS — K219 Gastro-esophageal reflux disease without esophagitis: Secondary | ICD-10-CM

## 2019-02-19 DIAGNOSIS — R002 Palpitations: Secondary | ICD-10-CM | POA: Diagnosis not present

## 2019-02-19 DIAGNOSIS — M7732 Calcaneal spur, left foot: Secondary | ICD-10-CM

## 2019-02-19 DIAGNOSIS — M6281 Muscle weakness (generalized): Secondary | ICD-10-CM

## 2019-02-19 DIAGNOSIS — M2141 Flat foot [pes planus] (acquired), right foot: Secondary | ICD-10-CM

## 2019-02-19 DIAGNOSIS — M62838 Other muscle spasm: Secondary | ICD-10-CM

## 2019-02-19 DIAGNOSIS — K580 Irritable bowel syndrome with diarrhea: Secondary | ICD-10-CM | POA: Diagnosis not present

## 2019-02-19 DIAGNOSIS — R112 Nausea with vomiting, unspecified: Secondary | ICD-10-CM

## 2019-02-19 DIAGNOSIS — K529 Noninfective gastroenteritis and colitis, unspecified: Secondary | ICD-10-CM | POA: Diagnosis not present

## 2019-02-19 DIAGNOSIS — K921 Melena: Secondary | ICD-10-CM

## 2019-02-19 DIAGNOSIS — M542 Cervicalgia: Secondary | ICD-10-CM | POA: Diagnosis not present

## 2019-02-19 DIAGNOSIS — Z01818 Encounter for other preprocedural examination: Secondary | ICD-10-CM

## 2019-02-19 DIAGNOSIS — Z9049 Acquired absence of other specified parts of digestive tract: Secondary | ICD-10-CM

## 2019-02-19 LAB — HEPATIC FUNCTION PANEL
ALT: 10 U/L (ref 0–35)
AST: 17 U/L (ref 0–37)
Albumin: 4 g/dL (ref 3.5–5.2)
Alkaline Phosphatase: 72 U/L (ref 39–117)
Bilirubin, Direct: 0.1 mg/dL (ref 0.0–0.3)
Total Bilirubin: 0.5 mg/dL (ref 0.2–1.2)
Total Protein: 7.6 g/dL (ref 6.0–8.3)

## 2019-02-19 LAB — SEDIMENTATION RATE: Sed Rate: 43 mm/hr — ABNORMAL HIGH (ref 0–20)

## 2019-02-19 LAB — IGA: IgA: 370 mg/dL (ref 68–378)

## 2019-02-19 LAB — CORTISOL: Cortisol, Plasma: 11.6 ug/dL

## 2019-02-19 LAB — HIGH SENSITIVITY CRP: CRP, High Sensitivity: 8.49 mg/L — ABNORMAL HIGH (ref 0.000–5.000)

## 2019-02-19 MED ORDER — SUPREP BOWEL PREP KIT 17.5-3.13-1.6 GM/177ML PO SOLN: 1.0000 | ORAL | 0 refills | Status: DC

## 2019-02-19 MED ORDER — IBUPROFEN 800 MG PO TABS: 800.0000 mg | ORAL_TABLET | Freq: Three times a day (TID) | ORAL | 0 refills | Status: DC | PRN

## 2019-02-19 NOTE — Patient Instructions (Signed)
Access Code: [email or web address]  URL: [email or web address]  Date: 02/19/2019  Prepared by: Ruben Im   Exercises Seated Cervical Retraction - 3 reps - 1 sets - 1x daily - 7x weekly Supine Passive Cervical Retraction - 10 reps - 1 sets - 3x daily - 7x weekly Seated Scapular Retraction - 10 reps - 1 sets - 3x daily - 7x weekly Seated Gentle Upper Trapezius Stretch - 3 reps - 1 sets - 20 hold - 3x daily - 7x weekly Doorway Rhomboid Stretch - 3 reps - 1 sets - 20 hold - 3x daily - 7x weekly    Trigger Point Dry Needling  . What is Trigger Point Dry Needling (DN)? o DN is a physical therapy technique used to treat muscle pain and dysfunction. Specifically, DN helps deactivate muscle trigger points (muscle knots).  o A thin filiform needle is used to penetrate the skin and stimulate the underlying trigger point. The goal is for a local twitch response (LTR) to occur and for the trigger point to relax. No medication of any kind is injected during the procedure.   . What Does Trigger Point Dry Needling Feel Like?  o The procedure feels different for each individual patient. Some patients report that they do not actually feel the needle enter the skin and overall the process is not painful. Very mild bleeding may occur. However, many patients feel a deep cramping in the muscle in which the needle was inserted. This is the local twitch response.   Marland Kitchen How Will I feel after the treatment? o Soreness is normal, and the onset of soreness may not occur for a few hours. Typically this soreness does not last longer than two days.  o Bruising is uncommon, however; ice can be used to decrease any possible bruising.  o In rare cases feeling tired or nauseous after the treatment is normal. In addition, your symptoms may get worse before they get better, this period will typically not last longer than 24 hours.   . What Can I do After My Treatment? o Increase your hydration by drinking more water for  the next 24 hours. o You may place ice or heat on the areas treated that have become sore, however, do not use heat on inflamed or bruised areas. Heat often brings more relief post needling. o You can continue your regular activities, but vigorous activity is not recommended initially after the treatment for 24 hours. o DN is best combined with other physical therapy such as strengthening, stretching, and other therapies.    Ruben Im PT [facility] [address], [place], [place] [address] Phone # [phone] Fax [phone]
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
## 2019-02-19 NOTE — Patient Instructions (Addendum)
You have been scheduled for a gastric emptying scan at [facility] on _____1/18/2021_________________ at _____________7:30am___. Please arrive at least 30 minutes prior to your appointment for registration. Please make certain not to have anything to eat or drink after midnight the night before your test. Hold all stomach medications (ex: Zofran, phenergan, Reglan) 48 hours prior to your test. If you need to reschedule your appointment, please contact radiology scheduling at [phone]. _____________________________________________________________________ A gastric-emptying study measures how long it takes for food to move through your stomach. There are several ways to measure stomach emptying. In the most common test, you eat food that contains a small amount of radioactive material. A scanner that detects the movement of the radioactive material is placed over your abdomen to monitor the rate at which food leaves your stomach. This test normally takes about 4 hours to complete. _____________________________________________________________________  Your provider has requested that you go to the basement level for lab work before leaving today. Press "B" on the elevator. The lab is located at the first door on the left as you exit the elevator.   If you are age 69 or younger, your body mass index should be between 19-25. Your Body mass index is 41.24 kg/m. If this is out of the aformentioned range listed, please consider follow up with your Primary Care Provider.   You have been scheduled for an endoscopy and colonoscopy. Please follow the written instructions given to you at your visit today. Please pick up your prep supplies at the pharmacy within the next 1-3 days. If you use inhalers (even only as needed), please bring them with you on the day of your procedure.  Thank you for choosing me and [facility].  Dr. Rush Landmark

## 2019-02-19 NOTE — Patient Instructions (Signed)

## 2019-02-19 NOTE — Patient Instructions (Signed)
Medication Instructions:  NO CHANGES *If you need a refill on your cardiac medications before your next appointment, please call your pharmacy*  Lab Work: NONE If you have labs (blood work) drawn today and your tests are completely normal, you will receive your results only by: Marland Kitchen MyChart Message (if you have MyChart) OR . A paper copy in the mail If you have any lab test that is abnormal or we need to change your treatment, we will call you to review the results.  Testing/Procedures: Your physician has requested that you have an echocardiogram. Echocardiography is a painless test that uses sound waves to create images of your heart. It provides your doctor with information about the size and shape of your heart and how well your heart's chambers and valves are working. This procedure takes approximately one hour. There are no restrictions for this procedure.  [place]  Follow-Up: At [facility], you and your health needs are our priority.  As part of our continuing mission to provide you with exceptional heart care, we have created designated Provider Care Teams.  These Care Teams include your primary Cardiologist (physician) and Advanced Practice Providers (APPs -  Physician Assistants and Nurse Practitioners) who all work together to provide you with the care you need, when you need it.  Your next appointment:   3 month(s)  The format for your next appointment:   Virtual Visit   Provider:   Eleonore Chiquito, MD  Other Instructions MONITOR WILL BE MAILED TO YOU AND MONITOR TECH WILL CALL TO GO OVER HOW TO PLACE THE MONITOR.

## 2019-02-19 NOTE — Telephone Encounter (Signed)
-----   Message from Trula Slade, DPM sent at 02/19/2019  2:00 PM EST ----- She is already going to PT at the place below for her shoulder. Can you put in a referral for them to work the left foot for plantar fasciitis/ankle tendonitis?  [facility] [address] [place], [place], [address] Phone: [phone]   Fax:  [phone]  Thanks.
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
## 2019-02-19 NOTE — Telephone Encounter (Signed)
Faxed orders to [facility].

## 2019-02-19 NOTE — Progress Notes (Signed)
Patient came in today to pick up custom made foot orthotics.  The goals were accomplished and the patient reported no dissatisfaction with said orthotics.  Patient was advised of breakin period and how to report any issues. 

## 2019-02-19 NOTE — Telephone Encounter (Signed)
7 day ZIO XT Long term holter monitor to be mailed to the patients home.  Instructions reviewed briefly as they are included in the monitor kit.

## 2019-02-20 ENCOUNTER — Other Ambulatory Visit (INDEPENDENT_AMBULATORY_CARE_PROVIDER_SITE_OTHER): Payer: BC Managed Care – PPO

## 2019-02-20 ENCOUNTER — Other Ambulatory Visit: Payer: Self-pay | Admitting: Family Medicine

## 2019-02-20 ENCOUNTER — Encounter: Payer: Self-pay | Admitting: Podiatry

## 2019-02-20 ENCOUNTER — Encounter: Payer: Self-pay | Admitting: Family Medicine

## 2019-02-20 DIAGNOSIS — R002 Palpitations: Secondary | ICD-10-CM

## 2019-02-20 DIAGNOSIS — K529 Noninfective gastroenteritis and colitis, unspecified: Secondary | ICD-10-CM | POA: Insufficient documentation

## 2019-02-20 DIAGNOSIS — Z9049 Acquired absence of other specified parts of digestive tract: Secondary | ICD-10-CM | POA: Insufficient documentation

## 2019-02-20 DIAGNOSIS — Z01818 Encounter for other preprocedural examination: Secondary | ICD-10-CM | POA: Insufficient documentation

## 2019-02-20 DIAGNOSIS — R194 Change in bowel habit: Secondary | ICD-10-CM | POA: Insufficient documentation

## 2019-02-20 DIAGNOSIS — F988 Other specified behavioral and emotional disorders with onset usually occurring in childhood and adolescence: Secondary | ICD-10-CM

## 2019-02-20 DIAGNOSIS — R1110 Vomiting, unspecified: Secondary | ICD-10-CM | POA: Insufficient documentation

## 2019-02-20 DIAGNOSIS — K921 Melena: Secondary | ICD-10-CM | POA: Insufficient documentation

## 2019-02-20 LAB — TISSUE TRANSGLUTAMINASE, IGA: (tTG) Ab, IgA: 1 U/mL

## 2019-02-21 ENCOUNTER — Other Ambulatory Visit: Payer: BC Managed Care – PPO

## 2019-02-21 ENCOUNTER — Encounter: Payer: Self-pay | Admitting: Physical Therapy

## 2019-02-21 ENCOUNTER — Encounter: Payer: Self-pay | Admitting: Family Medicine

## 2019-02-21 ENCOUNTER — Other Ambulatory Visit: Payer: Self-pay

## 2019-02-21 ENCOUNTER — Ambulatory Visit: Payer: BC Managed Care – PPO | Admitting: Physical Therapy

## 2019-02-21 DIAGNOSIS — M542 Cervicalgia: Secondary | ICD-10-CM | POA: Diagnosis not present

## 2019-02-21 DIAGNOSIS — M6281 Muscle weakness (generalized): Secondary | ICD-10-CM

## 2019-02-21 DIAGNOSIS — M62838 Other muscle spasm: Secondary | ICD-10-CM

## 2019-02-21 DIAGNOSIS — K529 Noninfective gastroenteritis and colitis, unspecified: Secondary | ICD-10-CM

## 2019-02-21 DIAGNOSIS — K580 Irritable bowel syndrome with diarrhea: Secondary | ICD-10-CM

## 2019-02-21 DIAGNOSIS — R112 Nausea with vomiting, unspecified: Secondary | ICD-10-CM

## 2019-02-21 NOTE — Patient Instructions (Signed)
Access Code: [email or web address]  URL: [email or web address]  Date: 02/21/2019  Prepared by: Ruben Im   Exercises Seated Cervical Retraction - 3 reps - 1 sets - 1x daily - 7x weekly Supine Passive Cervical Retraction - 10 reps - 1 sets - 3x daily - 7x weekly Seated Scapular Retraction - 10 reps - 1 sets - 3x daily - 7x weekly Seated Gentle Upper Trapezius Stretch - 3 reps - 1 sets - 20 hold - 3x daily - 7x weekly Doorway Rhomboid Stretch - 3 reps - 1 sets - 20 hold - 3x daily - 7x weekly Standing Median Nerve Glide - 10 reps - 1 sets - 1-2x daily - 7x weekly Seated Thoracic Lumbar Extension - 10 reps - 1 sets - 1-2x daily - 7x weekly Thoracic Extension Mobilization with Noodle - 10 reps - 1 sets - 1x daily - 7x weekly Thoracic Extension with Foam Roll - 10 reps - 1 sets - 1x daily - 7x weekly
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
## 2019-02-22 ENCOUNTER — Other Ambulatory Visit: Payer: Self-pay | Admitting: Family Medicine

## 2019-02-22 MED ORDER — THYROID 60 MG PO TABS: 60.0000 mg | ORAL_TABLET | Freq: Every day | ORAL | 2 refills | Status: DC

## 2019-02-25 ENCOUNTER — Other Ambulatory Visit: Payer: Self-pay

## 2019-02-25 ENCOUNTER — Ambulatory Visit (HOSPITAL_COMMUNITY)
Admission: RE | Admit: 2019-02-25 | Discharge: 2019-02-25 | Disposition: A | Discharge status: Home / Self Care | Payer: BC Managed Care – PPO | Source: Ambulatory Visit | Attending: Gastroenterology | Admitting: Gastroenterology

## 2019-02-25 DIAGNOSIS — K580 Irritable bowel syndrome with diarrhea: Secondary | ICD-10-CM | POA: Insufficient documentation

## 2019-02-25 DIAGNOSIS — K529 Noninfective gastroenteritis and colitis, unspecified: Secondary | ICD-10-CM | POA: Diagnosis not present

## 2019-02-25 DIAGNOSIS — R112 Nausea with vomiting, unspecified: Secondary | ICD-10-CM | POA: Insufficient documentation

## 2019-02-25 LAB — STOOL CULTURE
MICRO NUMBER:: 10042293
MICRO NUMBER:: 10042294
MICRO NUMBER:: 10042295
SHIGA RESULT:: NOT DETECTED
SPECIMEN QUALITY:: ADEQUATE
SPECIMEN QUALITY:: ADEQUATE
SPECIMEN QUALITY:: ADEQUATE

## 2019-02-25 LAB — OVA AND PARASITE EXAMINATION
CONCENTRATE RESULT:: NONE SEEN
MICRO NUMBER:: 10042292
SPECIMEN QUALITY:: ADEQUATE
TRICHROME RESULT:: NONE SEEN

## 2019-02-25 LAB — CLOSTRIDIUM DIFFICILE TOXIN B, QUALITATIVE, REAL-TIME PCR: Toxigenic C. Difficile by PCR: DETECTED — AB

## 2019-02-25 MED ORDER — METRONIDAZOLE 500 MG PO TABS: 500.0000 mg | ORAL_TABLET | Freq: Three times a day (TID) | ORAL | 0 refills | Status: AC

## 2019-02-25 MED ORDER — TECHNETIUM TC 99M SULFUR COLLOID
1.9000 | Freq: Once | INTRAVENOUS | Status: AC | PRN
  Administered 2019-02-25: 1.9 via INTRAVENOUS

## 2019-02-26 DIAGNOSIS — M722 Plantar fascial fibromatosis: Secondary | ICD-10-CM | POA: Insufficient documentation

## 2019-02-26 MED ORDER — METHYLPHENIDATE HCL ER (LA) 30 MG PO CP24: 30.0000 mg | ORAL_CAPSULE | ORAL | 0 refills | Status: DC

## 2019-02-26 MED ORDER — METHYLPHENIDATE HCL 10 MG PO TABS: ORAL_TABLET | ORAL | 0 refills | Status: DC

## 2019-02-26 NOTE — Progress Notes (Signed)
Subjective: 39 year old female presents the office today for follow-up evaluation of left heel pain, plantar fasciitis.  She states that overall she still having discomfort.  She states the brace is not been helping but she does also presents today to pick up orthotics.  She is been wearing the night splint.  The meloxicam has not been helping. Denies any systemic complaints such as fevers, chills, nausea, vomiting. No acute changes since last appointment, and no other complaints at this time.   Objective: AAO x3, NAD DP/PT pulses palpable bilaterally, CRT less than 3 seconds Tenderness on the plantar medial tubercle of the calcaneus at the insertion of the plantar fashion mildly on the arch of the foot.  There is no pain with lateral compression of calcaneus there is no other areas of discomfort.  No pain on the Achilles tendon.  Thompson test is negative. No open lesions or pre-ulcerative lesions.  No pain with calf compression, swelling, warmth, erythema  Assessment: Left foot plantar fasciitis  Plan: -All treatment options discussed with the patient including all alternatives, risks, complications.  -She presents to pick up orthotics.  Oral and written break-in instructions were discussed.  She is already going to physical therapy for other issues in regard to see if we can do physical therapy for the foot as well while she is going due to the plantar fasciitis (message sent to [facility] in our office to send referral for this). Continue home stretching, rehab as well as the night splint.  Anti-inflammatories as needed. -Patient encouraged to call the office with any questions, concerns, change in symptoms.   Trula Slade DPM

## 2019-02-27 DIAGNOSIS — K30 Functional dyspepsia: Secondary | ICD-10-CM | POA: Insufficient documentation

## 2019-03-04 ENCOUNTER — Ambulatory Visit (HOSPITAL_COMMUNITY): Payer: BC Managed Care – PPO | Attending: Cardiology

## 2019-03-04 ENCOUNTER — Other Ambulatory Visit: Payer: Self-pay

## 2019-03-04 DIAGNOSIS — R002 Palpitations: Secondary | ICD-10-CM

## 2019-03-05 ENCOUNTER — Other Ambulatory Visit: Payer: Self-pay

## 2019-03-05 ENCOUNTER — Ambulatory Visit: Payer: BC Managed Care – PPO

## 2019-03-05 DIAGNOSIS — M542 Cervicalgia: Secondary | ICD-10-CM

## 2019-03-05 DIAGNOSIS — M6281 Muscle weakness (generalized): Secondary | ICD-10-CM

## 2019-03-05 DIAGNOSIS — M62838 Other muscle spasm: Secondary | ICD-10-CM
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
## 2019-03-07 ENCOUNTER — Ambulatory Visit: Payer: BC Managed Care – PPO | Admitting: Physical Therapy

## 2019-03-15 ENCOUNTER — Ambulatory Visit: Payer: BC Managed Care – PPO | Attending: Podiatry | Admitting: Physical Therapy

## 2019-03-15 ENCOUNTER — Other Ambulatory Visit: Payer: Self-pay

## 2019-03-15 ENCOUNTER — Encounter: Payer: Self-pay | Admitting: Physical Therapy

## 2019-03-15 ENCOUNTER — Ambulatory Visit: Payer: BC Managed Care – PPO | Admitting: Physical Therapy

## 2019-03-15 DIAGNOSIS — M25672 Stiffness of left ankle, not elsewhere classified: Secondary | ICD-10-CM | POA: Insufficient documentation

## 2019-03-15 DIAGNOSIS — M79672 Pain in left foot: Secondary | ICD-10-CM | POA: Insufficient documentation

## 2019-03-15 DIAGNOSIS — M6281 Muscle weakness (generalized): Secondary | ICD-10-CM | POA: Insufficient documentation

## 2019-03-15 NOTE — Patient Instructions (Signed)
Access Code: [email or web address]  URL: [email or web address]  Date: 03/15/2019  Prepared by: Ruben Im   Exercises Seated Cervical Retraction - 3 reps - 1 sets - 1x daily - 7x weekly Supine Passive Cervical Retraction - 10 reps - 1 sets - 3x daily - 7x weekly Seated Scapular Retraction - 10 reps - 1 sets - 3x daily - 7x weekly Seated Gentle Upper Trapezius Stretch - 3 reps - 1 sets - 20 hold - 3x daily - 7x weekly Doorway Rhomboid Stretch - 3 reps - 1 sets - 20 hold - 3x daily - 7x weekly Standing Median Nerve Glide - 10 reps - 1 sets - 1-2x daily - 7x weekly Seated Thoracic Lumbar Extension - 10 reps - 1 sets - 1-2x daily - 7x weekly Thoracic Extension Mobilization with Noodle - 10 reps - 1 sets - 1x daily - 7x weekly Thoracic Extension with Foam Roll - 10 reps - 1 sets - 1x daily - 7x weekly Seated Calf Stretch with Strap - 3 reps - 1 sets - 20 hold - 1x daily - 7x weekly Long Sitting Ankle PROM Inversion Eversion - 5 reps - 1 sets - 1x daily - 7x weekly Seated Plantar Fascia Stretch - 10 reps - 1 sets - 1x daily - 7x weekly
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
## 2019-03-17 ENCOUNTER — Ambulatory Visit (INDEPENDENT_AMBULATORY_CARE_PROVIDER_SITE_OTHER): Payer: BC Managed Care – PPO

## 2019-03-17 DIAGNOSIS — R002 Palpitations: Secondary | ICD-10-CM

## 2019-03-19 ENCOUNTER — Encounter: Payer: BC Managed Care – PPO | Admitting: Gastroenterology

## 2019-03-21 ENCOUNTER — Ambulatory Visit: Payer: BC Managed Care – PPO | Attending: Family

## 2019-03-21 DIAGNOSIS — Z23 Encounter for immunization: Secondary | ICD-10-CM | POA: Insufficient documentation

## 2019-03-21 NOTE — Progress Notes (Signed)
   Covid-19 Vaccination Clinic  Name:  Taylor Yates    MRN: [id number] DOB: 1980/07/27  03/21/2019  [name] was observed post Covid-19 immunization for 15 minutes without incidence. She was provided with Vaccine Information Sheet and instruction to access the V-Safe system.   [name] was instructed to call 911 with any severe reactions post vaccine: Marland Kitchen Difficulty breathing  . Swelling of your face and throat  . A fast heartbeat  . A bad rash all over your body  . Dizziness and weakness    Immunizations Administered    Name Date Dose VIS Date Route   Moderna COVID-19 Vaccine 03/21/2019  3:05 PM 0.5 mL 01/08/2019 Intramuscular   Manufacturer: Moderna   Lot: [id number]   [id number]

## 2019-03-22 ENCOUNTER — Ambulatory Visit: Payer: BC Managed Care – PPO | Admitting: Gastroenterology

## 2019-03-22 ENCOUNTER — Encounter: Payer: Self-pay | Admitting: Gastroenterology

## 2019-03-22 ENCOUNTER — Other Ambulatory Visit: Payer: Self-pay

## 2019-03-22 VITALS — BP 130/70 | HR 106 | Temp 97.6°F | Ht 63.0 in | Wt 236.0 lb

## 2019-03-22 DIAGNOSIS — R194 Change in bowel habit: Secondary | ICD-10-CM | POA: Diagnosis not present

## 2019-03-22 DIAGNOSIS — K529 Noninfective gastroenteritis and colitis, unspecified: Secondary | ICD-10-CM | POA: Diagnosis not present

## 2019-03-22 DIAGNOSIS — Z8619 Personal history of other infectious and parasitic diseases: Secondary | ICD-10-CM

## 2019-03-22 DIAGNOSIS — K219 Gastro-esophageal reflux disease without esophagitis: Secondary | ICD-10-CM

## 2019-03-22 DIAGNOSIS — Z659 Problem related to unspecified psychosocial circumstances: Secondary | ICD-10-CM

## 2019-03-22 DIAGNOSIS — K30 Functional dyspepsia: Secondary | ICD-10-CM

## 2019-03-22 NOTE — Patient Instructions (Signed)
You have been scheduled for an endoscopy and colonoscopy. Please follow the written instructions given to you at your visit today. Please pick up your prep supplies at the pharmacy within the next 1-3 days. If you use inhalers (even only as needed), please bring them with you on the day of your procedure.  Start Pepcid 20mg  -over the counter  (1-2 daily )  If you are age 41 or older, your body mass index should be between 23-30. Your Body mass index is 41.81 kg/m. If this is out of the aforementioned range listed, please consider follow up with your Primary Care Provider.  If you are age 63 or younger, your body mass index should be between 19-25. Your Body mass index is 41.81 kg/m. If this is out of the aformentioned range listed, please consider follow up with your Primary Care Provider.    Thank you for choosing me and [facility].  Dr. Rush Landmark
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
## 2019-03-23 ENCOUNTER — Encounter: Payer: Self-pay | Admitting: Gastroenterology

## 2019-03-23 DIAGNOSIS — Z8619 Personal history of other infectious and parasitic diseases: Secondary | ICD-10-CM | POA: Insufficient documentation

## 2019-03-23 DIAGNOSIS — Z659 Problem related to unspecified psychosocial circumstances: Secondary | ICD-10-CM | POA: Insufficient documentation

## 2019-03-28 ENCOUNTER — Encounter: Payer: Self-pay | Admitting: Gastroenterology

## 2019-04-01 ENCOUNTER — Ambulatory Visit (INDEPENDENT_AMBULATORY_CARE_PROVIDER_SITE_OTHER): Payer: BC Managed Care – PPO

## 2019-04-01 ENCOUNTER — Other Ambulatory Visit: Payer: Self-pay | Admitting: Gastroenterology

## 2019-04-01 DIAGNOSIS — Z1159 Encounter for screening for other viral diseases: Secondary | ICD-10-CM

## 2019-04-02 ENCOUNTER — Telehealth: Payer: Self-pay | Admitting: Gastroenterology

## 2019-04-02 ENCOUNTER — Other Ambulatory Visit: Payer: Self-pay

## 2019-04-02 ENCOUNTER — Ambulatory Visit: Payer: BC Managed Care – PPO | Admitting: Podiatry

## 2019-04-02 ENCOUNTER — Encounter: Payer: Self-pay | Admitting: Podiatry

## 2019-04-02 DIAGNOSIS — M2142 Flat foot [pes planus] (acquired), left foot: Secondary | ICD-10-CM | POA: Diagnosis not present

## 2019-04-02 DIAGNOSIS — M2141 Flat foot [pes planus] (acquired), right foot: Secondary | ICD-10-CM

## 2019-04-02 DIAGNOSIS — M722 Plantar fascial fibromatosis: Secondary | ICD-10-CM | POA: Diagnosis not present

## 2019-04-02 DIAGNOSIS — M7732 Calcaneal spur, left foot: Secondary | ICD-10-CM | POA: Diagnosis not present

## 2019-04-02 NOTE — Telephone Encounter (Signed)
The pt has been advised that priro authorizations are not done on preps for colonoscopies due to the fact that insurance wants the pt to have tried and failed an alternative.  For that reason preps are generally not authorized.  The pt states she will purchase the prep as ordered.

## 2019-04-02 NOTE — Patient Instructions (Signed)

## 2019-04-04 ENCOUNTER — Ambulatory Visit (AMBULATORY_SURGERY_CENTER): Payer: BC Managed Care – PPO | Admitting: Gastroenterology

## 2019-04-04 ENCOUNTER — Encounter: Payer: Self-pay | Admitting: Gastroenterology

## 2019-04-04 ENCOUNTER — Other Ambulatory Visit: Payer: Self-pay

## 2019-04-04 ENCOUNTER — Telehealth: Payer: Self-pay | Admitting: Internal Medicine

## 2019-04-04 VITALS — BP 108/71 | HR 63 | Temp 96.9°F | Resp 13 | Ht 63.0 in | Wt 236.0 lb

## 2019-04-04 DIAGNOSIS — K2950 Unspecified chronic gastritis without bleeding: Secondary | ICD-10-CM

## 2019-04-04 DIAGNOSIS — K529 Noninfective gastroenteritis and colitis, unspecified: Secondary | ICD-10-CM | POA: Diagnosis not present

## 2019-04-04 DIAGNOSIS — K641 Second degree hemorrhoids: Secondary | ICD-10-CM | POA: Diagnosis not present

## 2019-04-04 DIAGNOSIS — K30 Functional dyspepsia: Secondary | ICD-10-CM

## 2019-04-04 DIAGNOSIS — K2100 Gastro-esophageal reflux disease with esophagitis, without bleeding: Secondary | ICD-10-CM

## 2019-04-04 DIAGNOSIS — K2270 Barrett's esophagus without dysplasia: Secondary | ICD-10-CM | POA: Diagnosis not present

## 2019-04-04 DIAGNOSIS — K3189 Other diseases of stomach and duodenum: Secondary | ICD-10-CM

## 2019-04-04 MED ORDER — SODIUM CHLORIDE 0.9 % IV SOLN: 500.0000 mL | Freq: Once | INTRAVENOUS | Status: DC

## 2019-04-04 NOTE — Op Note (Signed)
[place] Patient Name: Taylor Yates Procedure Date: 04/04/2019 7:55 AM MRN: [id number] Endoscopist: Justice Britain , MD Age: 39 Referring MD:  Date of Birth: 1980/02/21 Gender: Female Account #: [id number] Procedure:                Upper GI endoscopy Indications:              Generalized abdominal pain, Gastro-esophageal                            reflux disease, Esophageal reflux symptoms that                            persist despite appropriate therapy, Diarrhea Medicines:                Monitored Anesthesia Care Procedure:                Pre-Anesthesia Assessment:                           - Prior to the procedure, a History and Physical                            was performed, and patient medications and                            allergies were reviewed. The patient's tolerance of                            previous anesthesia was also reviewed. The risks                            and benefits of the procedure and the sedation                            options and risks were discussed with the patient.                            All questions were answered, and informed consent                            was obtained. Prior Anticoagulants: The patient has                            taken no previous anticoagulant or antiplatelet                            agents. ASA Grade Assessment: III - A patient with                            severe systemic disease. After reviewing the risks                            and benefits, the patient was deemed in  satisfactory condition to undergo the procedure.                           After obtaining informed consent, the endoscope was                            passed under direct vision. Throughout the                            procedure, the patient's blood pressure, pulse, and                            oxygen saturations were monitored continuously. The   Endoscope was introduced through the mouth, and                            advanced to the second part of duodenum. The upper                            GI endoscopy was accomplished without difficulty.                            The patient tolerated the procedure. Scope In: Scope Out: Findings:                 No gross lesions were noted in the proximal                            esophagus, in the mid esophagus and in the distal                            esophagus. Biopsies were taken with a cold forceps                            for histology.                           LA Grade A (one or more mucosal breaks less than 5                            mm, not extending between tops of 2 mucosal folds)                            esophagitis with no bleeding was found at the                            gastroesophageal junction. This was biopsied with a                            cold forceps for histology to rule out underlying                            Barrett's esophagus.  The Z-line was irregular and was found 35 cm from                            the incisors.                           Patchy mildly erythematous mucosa without bleeding                            was found in the gastric body and in the gastric                            antrum.                           No other gross lesions were noted in the entire                            examined stomach. Biopsies were taken with a cold                            forceps for histology and Helicobacter pylori                            testing.                           No gross lesions were noted in the duodenal bulb,                            in the first portion of the duodenum and in the                            second portion of the duodenum. Biopsies were taken                            with a cold forceps for histology to rule out                            Celiac/Enteropathy. Complications:             No immediate complications. Estimated Blood Loss:     Estimated blood loss was minimal. Impression:               - No gross lesions in esophagus. Biopsied.                           - LA Grade A esophagitis at the [employer] with no                            bleeding. Biopsied.                           - Z-line irregular, 35 cm from the incisors.                           -  Erythematous mucosa in the gastric body and                            antrum. No other gross lesions in the stomach.                            Biopsied.                           - No gross lesions in the duodenal bulb, in the                            first portion of the duodenum and in the second                            portion of the duodenum. Biopsied. Recommendation:           - Proceed to scheduled colonoscopy.                           - Await pathology results.                           - Observe patient's clinical course.                           - Continue present medications.                           - The findings and recommendations were discussed                            with the patient. Justice Britain, MD 04/04/2019 8:52:18 AM

## 2019-04-04 NOTE — Patient Instructions (Signed)
YOU HAD AN ENDOSCOPIC PROCEDURE TODAY AT THE [facility]:   Refer to the procedure report that was given to you for any specific questions about what was found during the examination.  If the procedure report does not answer your questions, please call your gastroenterologist to clarify.  If you requested that your care partner not be given the details of your procedure findings, then the procedure report has been included in a sealed envelope for you to review at your convenience later.  YOU SHOULD EXPECT: Some feelings of bloating in the abdomen. Passage of more gas than usual.  Walking can help get rid of the air that was put into your GI tract during the procedure and reduce the bloating. If you had a lower endoscopy (such as a colonoscopy or flexible sigmoidoscopy) you may notice spotting of blood in your stool or on the toilet paper. If you underwent a bowel prep for your procedure, you may not have a normal bowel movement for a few days.  Please Note:  You might notice some irritation and congestion in your nose or some drainage.  This is from the oxygen used during your procedure.  There is no need for concern and it should clear up in a day or so.  SYMPTOMS TO REPORT IMMEDIATELY:   Following lower endoscopy (colonoscopy or flexible sigmoidoscopy):  Excessive amounts of blood in the stool  Significant tenderness or worsening of abdominal pains  Swelling of the abdomen that is new, acute  Fever of 100F or higher   Following upper endoscopy (EGD)  Vomiting of blood or coffee ground material  New chest pain or pain under the shoulder blades  Painful or persistently difficult swallowing  New shortness of breath  Fever of 100F or higher  Black, tarry-looking stools  For urgent or emergent issues, a gastroenterologist can be reached at any hour by calling [phone].   DIET:  We do recommend a small meal at first, but then you may proceed to your regular diet.  Drink  plenty of fluids but you should avoid alcoholic beverages for 24 hours.  ACTIVITY:  You should plan to take it easy for the rest of today and you should NOT DRIVE or use heavy machinery until tomorrow (because of the sedation medicines used during the test).    FOLLOW UP: Our staff will call the number listed on your records 48-72 hours following your procedure to check on you and address any questions or concerns that you may have regarding the information given to you following your procedure. If we do not reach you, we will leave a message.  We will attempt to reach you two times.  During this call, we will ask if you have developed any symptoms of COVID 19. If you develop any symptoms (ie: fever, flu-like symptoms, shortness of breath, cough etc.) before then, please call [phone].  If you test positive for Covid 19 in the 2 weeks post procedure, please call and report this information to us.    If any biopsies were taken you will be contacted by phone or by letter within the next 1-3 weeks.  Please call us at [phone] if you have not heard about the biopsies in 3 weeks.    SIGNATURES/CONFIDENTIALITY: You and/or your care partner have signed paperwork which will be entered into your electronic medical record.  These signatures attest to the fact that that the information above on your After Visit Summary has been reviewed and is   understood.  Full responsibility of the confidentiality of this discharge information lies with you and/or your care-partner. 

## 2019-04-04 NOTE — Progress Notes (Signed)
Called to room to assist during endoscopic procedure.  Patient ID and intended procedure confirmed with present staff. Received instructions for my participation in the procedure from the performing physician.  

## 2019-04-04 NOTE — Progress Notes (Signed)
Patient to be scheduled for CT and given contrast and sheet at discharge today.

## 2019-04-04 NOTE — Progress Notes (Signed)
A and O x3. Report to RN. Tolerated MAC anesthesia well.Teeth unchanged after procedure.

## 2019-04-04 NOTE — Progress Notes (Signed)
Temp JB Vitals CW 

## 2019-04-04 NOTE — Telephone Encounter (Signed)
Having some lower chest pressure and abdominal pain also - similar to chronic pre-procedure sxs but more frequent since EGD and bx and diagnostic colonoscopy today.  No fever, no bleeding, no stool.  Some flatus and some belching.  Advised try her hyoscyamine, call back or go to ED if she has bleeding, fever or severe pain

## 2019-04-04 NOTE — Op Note (Signed)
[place] Patient Name: Taylor Yates Procedure Date: 04/04/2019 7:55 AM MRN: [id number] Endoscopist: Justice Britain , MD Age: 39 Referring MD:  Date of Birth: 02/26/1980 Gender: Female Account #: [id number] Procedure:                Colonoscopy Indications:              Generalized abdominal pain, Chronic diarrhea                            (recent C. difficile s/p treatment with persistent                            symptoms which had also predated the chronicity of                            the diarrhea) Medicines:                Monitored Anesthesia Care Procedure:                Pre-Anesthesia Assessment:                           - Prior to the procedure, a History and Physical                            was performed, and patient medications and                            allergies were reviewed. The patient's tolerance of                            previous anesthesia was also reviewed. The risks                            and benefits of the procedure and the sedation                            options and risks were discussed with the patient.                            All questions were answered, and informed consent                            was obtained. Prior Anticoagulants: The patient has                            taken no previous anticoagulant or antiplatelet                            agents. ASA Grade Assessment: III - A patient with                            severe systemic disease. After reviewing the risks  and benefits, the patient was deemed in                            satisfactory condition to undergo the procedure.                           After obtaining informed consent, the colonoscope                            was passed under direct vision. Throughout the                            procedure, the patient's blood pressure, pulse, and                            oxygen saturations were monitored  continuously. The                            Colonoscope was introduced through the anus and                            advanced to the 10 cm into the ileum. The                            colonoscopy was performed without difficulty. The                            patient tolerated the procedure. The quality of the                            bowel preparation was good. The terminal ileum,                            ileocecal valve, appendiceal orifice, and rectum                            were photographed. Scope In: 8:27:11 AM Scope Out: 8:44:00 AM Scope Withdrawal Time: 0 hours 10 minutes 55 seconds  Total Procedure Duration: 0 hours 16 minutes 49 seconds  Findings:                 The digital rectal exam findings include                            hemorrhoids. Pertinent negatives include no                            palpable rectal lesions.                           The terminal ileum and ileocecal valve appeared                            normal. Biopsies were taken with a cold forceps for  histology to rule out chronic ileitis.                           There was an extrinsic impression - query                            medium-sized submucosal lesion that did not have a                            typical pillow sign noted in the cecum - adjacent                            but not involving the appendiceal orifice region.                           Normal mucosa was found in the entire colon                            otherwise. Biopsies were taken with a cold forceps                            for histology to rule out chronic                            colitis/microscopic colitis.                           Non-bleeding non-thrombosed internal hemorrhoids                            were found during retroflexion, during perianal                            exam and during digital exam. The hemorrhoids were                            Grade II (internal  hemorrhoids that prolapse but                            reduce spontaneously). Complications:            No immediate complications. Estimated Blood Loss:     Estimated blood loss was minimal. Impression:               - Hemorrhoids found on digital rectal exam.                           - The examined portion of the ileum was normal.                            Biopsied.                           - Extrinsic impression vs submucosal lesion noted  in cecum - not involving appendiceal orifice                            without pillow sign.                           - Normal mucosa in the entire examined colon                            otherwise. Biopsied.                           - Non-bleeding non-thrombosed internal hemorrhoids. Recommendation:           - The patient will be observed post-procedure,                            until all discharge criteria are met.                           - Discharge patient to home.                           - Patient has a contact number available for                            emergencies. The signs and symptoms of potential                            delayed complications were discussed with the                            patient. Return to normal activities tomorrow.                            Written discharge instructions were provided to the                            patient.                           - High fiber diet.                           - Continue present medications.                           - Await pathology results.                           - Repeat colonoscopy in 10 years for screening                            purposes.                           - Recommend non-urgent CT-Abdomen/Pelvis with IV/PO  contrast to evaluate the region of the cecum to see                            if a submucosal lesion or other etiology is found                            for the slight  extrinsic impression.                           - The findings and recommendations were discussed                            with the patient. Justice Britain, MD 04/04/2019 8:59:05 AM

## 2019-04-05 ENCOUNTER — Other Ambulatory Visit: Payer: Self-pay

## 2019-04-05 DIAGNOSIS — R933 Abnormal findings on diagnostic imaging of other parts of digestive tract: Secondary | ICD-10-CM

## 2019-04-05 NOTE — Telephone Encounter (Signed)
Left message on machine to call back  

## 2019-04-05 NOTE — Progress Notes (Signed)
You are scheduled on 04/12/19  at 730 am. You should arrive 15 minutes prior to your appointment time for registration.   You will need to go to [facility] to pick up contrast and instructions at least a day or two prior to the appointment.

## 2019-04-05 NOTE — Telephone Encounter (Signed)
Per procedure report pt needs non urgent CT abd pelvis with to eval the abnormal area in the cecum

## 2019-04-05 NOTE — Telephone Encounter (Signed)
The pt states that she feels much better than she felt yesterday.  She will call back with any further symptoms.  I have also set her up for her CT scan.

## 2019-04-05 NOTE — Telephone Encounter (Signed)
Thank you for update CG. Taylor Yates, can you please reach out to patient this AM and check on her? Thanks. GM

## 2019-04-06 NOTE — Telephone Encounter (Signed)
Thank you  for update

## 2019-04-07 NOTE — Progress Notes (Signed)
Subjective: 39 year old female presents the office today for follow evaluation of left foot plantar fasciitis.  She states overall she is doing about the same some discomfort especially after being on her feet all day.  She is been to 1 physical therapy session.  She does a stretching, rehab exercises intermittently at home.  She states the right orthotic is more comfortable than the left.  She uses a night splint intermittently.  She states that overall she has been more consistent with treatment.  Denies any systemic complaints such as fevers, chills, nausea, vomiting. No acute changes since last appointment, and no other complaints at this time.   Objective: AAO x3, NAD DP/PT pulses palpable bilaterally, CRT less than 3 seconds There is tenderness palpation of the plantar medial tubercle of the calcaneus at insertion of plantar fashion on the left side.  No pain to the right side.  There is no pain with lateral compression of calcaneus along the course or insertion of the Achilles tendon.  Negative Tinel sign. No edema, erythema, increase in warmth to bilateral lower extremities.  No open lesions or pre-ulcerative lesions.  No pain with calf compression, swelling, warmth, erythema  Assessment: 39 year old female left foot plantar fasciitis, chronic heel pain  Plan: -All treatment options discussed with the patient including all alternatives, risks, complications.  -Orthotic is uncomfortable.  I marked the areas and orthotics that were uncomfortable and I discussed this with Liliane Channel.  They can work on modifications.  For now dispensed power steps.  I want her to be more consistent with physical therapy and home stretching, rehab.  Continue with supportive shoes we discussed changing shoes.  She was to hold off on steroid injection. -Patient encouraged to call the office with any questions, concerns, change in symptoms.   Return in about 6 weeks (around 05/14/2019).  Trula Slade DPM

## 2019-04-08 ENCOUNTER — Telehealth: Payer: Self-pay | Admitting: *Deleted

## 2019-04-08 NOTE — Telephone Encounter (Signed)
  Follow up Call-  Call back number 04/04/2019  Post procedure Call Back phone  # [phone]  Permission to leave phone message Yes  Some recent data might be hidden     Patient questions:  Do you have a fever, pain , or abdominal swelling? No. Pain Score  0 *  Have you tolerated food without any problems? Yes.    Have you been able to return to your normal activities? Yes.    Do you have any questions about your discharge instructions: Diet   No. Medications  No. Follow up visit  No.  Do you have questions or concerns about your Care? No.  Actions: * If pain score is 4 or above: No action needed, pain <4.  1. Have you developed a fever since your procedure? no  2.   Have you had an respiratory symptoms (SOB or cough) since your procedure? no  3.   Have you tested positive for COVID 19 since your procedure no  4.   Have you had any family members/close contacts diagnosed with the COVID 19 since your procedure?  no   If yes to any of these questions please route to Joylene John, RN and Alphonsa Gin, Therapist, sports.

## 2019-04-12 ENCOUNTER — Encounter (HOSPITAL_COMMUNITY): Payer: Self-pay

## 2019-04-12 ENCOUNTER — Ambulatory Visit (HOSPITAL_COMMUNITY)
Admission: RE | Admit: 2019-04-12 | Discharge: 2019-04-12 | Disposition: A | Discharge status: Home / Self Care | Payer: BC Managed Care – PPO | Source: Ambulatory Visit | Attending: Gastroenterology | Admitting: Gastroenterology

## 2019-04-12 ENCOUNTER — Other Ambulatory Visit: Payer: Self-pay

## 2019-04-12 DIAGNOSIS — R933 Abnormal findings on diagnostic imaging of other parts of digestive tract: Secondary | ICD-10-CM | POA: Insufficient documentation

## 2019-04-12 MED ORDER — SODIUM CHLORIDE (PF) 0.9 % IJ SOLN
INTRAMUSCULAR | Status: AC
  Filled 2019-04-12: qty 50

## 2019-04-12 MED ORDER — IOHEXOL 300 MG/ML  SOLN
100.0000 mL | Freq: Once | INTRAMUSCULAR | Status: AC | PRN
  Administered 2019-04-12: 100 mL via INTRAVENOUS

## 2019-04-14 ENCOUNTER — Encounter: Payer: Self-pay | Admitting: Gastroenterology

## 2019-04-15 ENCOUNTER — Telehealth: Payer: Self-pay | Admitting: Gastroenterology

## 2019-04-15 NOTE — Telephone Encounter (Signed)
Patient is returning your call.  

## 2019-04-15 NOTE — Telephone Encounter (Signed)
The pt has been advised of all the findings and states she will follow up with PCP.  She also states she has dexilant on hand but has not been taking it.  She will start that today and call back if she has any problems or concerns.  She will also call back to make appt for follow up.

## 2019-04-15 NOTE — Telephone Encounter (Signed)
Taylor Yates, Please reach out to the patient and let her know that I have reviewed the CT abdomen/pelvis with radiology. [name] has clips in the region of her previous appendectomy there is a small chance that the ileum may be in very close approximation to the cecum such that it could cause some possible extrinsic compression from either of these cases. No lipoma or focal mass was noted which is good news. She also has a duplicated renal collecting system which is not a concern for most people but she can discuss this with her primary care doctor as well. 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  Separate result note with patient's EGD/colonoscopy biopsies was sent to you [name]).  Thanks.  GM  Start dexilant 60 mg daily. Follow-up in clinic in 4 to 6 weeks.

## 2019-04-23 ENCOUNTER — Ambulatory Visit: Payer: BC Managed Care – PPO | Attending: Family

## 2019-04-23 DIAGNOSIS — Z23 Encounter for immunization: Secondary | ICD-10-CM

## 2019-04-23 NOTE — Progress Notes (Signed)
   Covid-19 Vaccination Clinic  Name:  Taylor Yates    MRN: [id number] DOB: 1980/07/07  04/23/2019  Taylor Yates was observed post Covid-19 immunization for 15 minutes without incident. She was provided with Vaccine Information Sheet and instruction to access the V-Safe system.   Taylor Yates was instructed to call 911 with any severe reactions post vaccine: Marland Kitchen Difficulty breathing  . Swelling of face and throat  . A fast heartbeat  . A bad rash all over body  . Dizziness and weakness   Immunizations Administered    Name Date Dose VIS Date Route   Moderna COVID-19 Vaccine 04/23/2019  3:00 PM 0.5 mL 01/08/2019 Intramuscular   Manufacturer: Moderna   Lot: [id number]   [id number]

## 2019-05-01 ENCOUNTER — Telehealth: Payer: Self-pay | Admitting: *Deleted

## 2019-05-01 ENCOUNTER — Encounter: Payer: Self-pay | Admitting: Podiatry

## 2019-05-01 NOTE — Telephone Encounter (Signed)
Patient called and stated she may have stepped wrong last week on the right foot and hurts in the arch area and wanted to see if we could see her and per Dr Jacqualyn Posey can see her tomorrow at 4:30 pm and I called the patient back to let her know. Lattie Haw

## 2019-05-02 ENCOUNTER — Other Ambulatory Visit: Payer: Self-pay

## 2019-05-02 ENCOUNTER — Encounter: Payer: Self-pay | Admitting: Podiatry

## 2019-05-02 ENCOUNTER — Ambulatory Visit: Payer: BC Managed Care – PPO | Admitting: Podiatry

## 2019-05-02 ENCOUNTER — Ambulatory Visit (INDEPENDENT_AMBULATORY_CARE_PROVIDER_SITE_OTHER): Payer: BC Managed Care – PPO

## 2019-05-02 DIAGNOSIS — M79671 Pain in right foot: Secondary | ICD-10-CM

## 2019-05-02 DIAGNOSIS — M722 Plantar fascial fibromatosis: Secondary | ICD-10-CM | POA: Diagnosis not present

## 2019-05-02 DIAGNOSIS — M629 Disorder of muscle, unspecified: Secondary | ICD-10-CM | POA: Diagnosis not present

## 2019-05-03 ENCOUNTER — Telehealth: Payer: Self-pay | Admitting: *Deleted

## 2019-05-03 NOTE — Telephone Encounter (Signed)
I spoke with pt and informed she would benefit from being in the boot as much as possible, stabilizing the tendon would allow to rest and heal, if possible sleep in the boot but if not able to rest then don't sleep in the boot, perform ADL only and allow the area to rest. Pt states understanding.

## 2019-05-03 NOTE — Telephone Encounter (Signed)
Pt states she needs instructions on her boot, she was seen yesterday and everyone was rushing to go home, she did get instructions on the wearing.

## 2019-05-07 NOTE — Progress Notes (Signed)
Subjective: 39 year old female presents the office today for concerns of right foot pain.  She states that over the last week she has noticed a "knot" in the arch of her foot.  No significant injury other than she stepped in putting her arch area on a curb.  She did not notice any popping sensations.  She has had some swelling to the area.  No redness or warmth of the area is tender particular with pressure in shoes.  Right-sided been doing better but she is only doing physical therapy 1 time he does not work.Denies any systemic complaints such as fevers, chills, nausea, vomiting. No acute changes since last appointment, and no other complaints at this time.   Objective: AAO x3, NAD DP/PT pulses palpable bilaterally, CRT less than 3 seconds Left foot seems to do better.  However today in the right foot there is tenderness in the arch of the foot and there is a palpable mass on the arch of the foot.  I am able to palpate the borders of the plantar fascia I do not think this is ruptured.  Tenderness palpation of this area however.  No pain in the Achilles tendon.  No pain to the posterior tibial, flexor tendons.  Extensor tendons intact. No open lesions or pre-ulcerative lesions.  No pain with calf compression, swelling, warmth, erythema  Assessment: Concern for partial tear plantar fascia right side  Plan: -All treatment options discussed with the patient including all alternatives, risks, complications.  -X-rays obtained reviewed.  No evidence of acute fracture or stress fracture. -Given the pain and swelling of the right foot recommend immobilization in a cam boot which was dispensed today.  Ice to the area.  Anti-inflammatories.  If symptoms continue MRI. -Regards to the left foot continue stretching, supportive shoes.  Continue physical therapy on the left side. -Patient encouraged to call the office with any questions, concerns, change in symptoms.   Return in 3 weeks (on  05/23/2019).  Trula Slade DPM

## 2019-05-14 ENCOUNTER — Ambulatory Visit: Payer: BC Managed Care – PPO | Admitting: Podiatry

## 2019-05-27 ENCOUNTER — Other Ambulatory Visit: Payer: Self-pay

## 2019-05-27 ENCOUNTER — Encounter: Payer: Self-pay | Admitting: Podiatry

## 2019-05-27 ENCOUNTER — Ambulatory Visit: Payer: BC Managed Care – PPO | Admitting: Podiatry

## 2019-05-27 VITALS — Temp 97.3°F

## 2019-05-27 DIAGNOSIS — M629 Disorder of muscle, unspecified: Secondary | ICD-10-CM

## 2019-05-27 DIAGNOSIS — M79671 Pain in right foot: Secondary | ICD-10-CM

## 2019-05-27 DIAGNOSIS — M722 Plantar fascial fibromatosis: Secondary | ICD-10-CM

## 2019-05-27 DIAGNOSIS — M779 Enthesopathy, unspecified: Secondary | ICD-10-CM
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
## 2019-05-27 NOTE — Patient Instructions (Signed)

## 2019-05-28 ENCOUNTER — Telehealth (INDEPENDENT_AMBULATORY_CARE_PROVIDER_SITE_OTHER): Payer: BC Managed Care – PPO | Admitting: Cardiovascular Disease

## 2019-05-28 ENCOUNTER — Encounter: Payer: Self-pay | Admitting: Cardiovascular Disease

## 2019-05-28 VITALS — HR 86 | Ht 63.0 in

## 2019-05-28 DIAGNOSIS — R002 Palpitations: Secondary | ICD-10-CM | POA: Diagnosis not present

## 2019-05-28 DIAGNOSIS — R5383 Other fatigue: Secondary | ICD-10-CM | POA: Diagnosis not present

## 2019-05-28 NOTE — Progress Notes (Signed)
Subjective: 39 year old female presents the office today for follow-up evaluation Of partial tear plantar fascia right foot.  She states that overall she is doing better in the cam boot has been helping.  She states that sometimes it is tender to touch but the swelling and pain is improved.  Left side is not as bad as previous.  Still gets some intermittent discomfort.  She has not yet restarted physical therapy.  She presents today to the hospital for orthotics. Denies any systemic complaints such as fevers, chills, nausea, vomiting. No acute changes since last appointment, and no other complaints at this time.   Objective: AAO x3, NAD DP/PT pulses palpable bilaterally, CRT less than 3 seconds Mild discomfort of the left side along the course of plantar fascial insertion.  There is no pain with lateral compression of calcaneus.  No edema, erythema. On the right side there is still mild discomfort on the medial band plantar fashion the arch of the foot and mild thickening of plantar fashion identified.  No discomfort along the flexor, posterior tibial tendon but overall tendon appears to be intact and there is no edema to this area.  There is no area of pinpoint tenderness.  No other areas of tenderness identified bilaterally. No open lesions or pre-ulcerative lesions.  No pain with calf compression, swelling, warmth, erythema  Assessment: Concern for partial tear plantar fascia right side-with improvement; left side plantar fasciitis  Plan: -All treatment options discussed with the patient including all alternatives, risks, complications.  -Overall the right side is doing better.  Discussed MRI but she wants to hold off on this.  Would continue the cam boot for now continue ice elevate.  As she starts to feel better she can start to transition to regular shoe.  Orthotics were dispensed today.  Oral and written break-in instructions were discussed.  As she is back into a shoe would recommend  restarting physical therapy.  She is only had one session so far.  Return in about 4 weeks (around 06/24/2019).  Trula Slade DPM

## 2019-05-28 NOTE — Patient Instructions (Signed)
Medication Instructions:  The current medical regimen is effective;  continue present plan and medications.  *If you need a refill on your cardiac medications before your next appointment, please call your pharmacy*   Testing/Procedures: Your physician has recommended that you have a sleep study. This test records several body functions during sleep, including: brain activity, eye movement, oxygen and carbon dioxide blood levels, heart rate and rhythm, breathing rate and rhythm, the flow of air through your mouth and nose, snoring, body muscle movements, and chest and belly movement.   Follow-Up: At [facility], you and your health needs are our priority.  As part of our continuing mission to provide you with exceptional heart care, we have created designated Provider Care Teams.  These Care Teams include your primary Cardiologist (physician) and Advanced Practice Providers (APPs -  Physician Assistants and Nurse Practitioners) who all work together to provide you with the care you need, when you need it.  We recommend signing up for the patient portal called "MyChart".  Sign up information is provided on this After Visit Summary.  MyChart is used to connect with patients for Virtual Visits (Telemedicine).  Patients are able to view lab/test results, encounter notes, upcoming appointments, etc.  Non-urgent messages can be sent to your provider as well.   To learn more about what you can do with MyChart, go to [email or web address].    Your next appointment:   As needed  The format for your next appointment:   In Person  Provider:   Eleonore Chiquito, MD

## 2019-06-24 ENCOUNTER — Other Ambulatory Visit: Payer: Self-pay

## 2019-06-24 ENCOUNTER — Ambulatory Visit: Payer: BC Managed Care – PPO | Admitting: Podiatry

## 2019-06-24 DIAGNOSIS — M629 Disorder of muscle, unspecified: Secondary | ICD-10-CM | POA: Diagnosis not present

## 2019-06-24 DIAGNOSIS — M722 Plantar fascial fibromatosis: Secondary | ICD-10-CM

## 2019-06-24 NOTE — Patient Instructions (Signed)

## 2019-07-01 NOTE — Progress Notes (Signed)
Subjective: 39 year old female presents the office today for follow-up evaluation of partial tear plantar fascia right foot including x-rays of her left foot.  Overall she says she is doing better she is continuing improvement.  Is back to wearing regular shoes she describes some soreness but much better.  She is doing home physical therapy but no formal physical therapy due to time issues with work.  No significant swelling.  No weakness or falls or weakness since I last saw her. Denies any systemic complaints such as fevers, chills, nausea, vomiting. No acute changes since last appointment, and no other complaints at this time.   Objective: AAO x3, NAD DP/PT pulses palpable bilaterally, CRT less than 3 seconds Mild discomfort of the left side along the course of plantar fascial insertion.  There is no pain with lateral compression of calcaneus.  No edema, erythema. On the right side there is still minimal discomfort on the medial band plantar fascia on the arch of the foot and mild tenderness on medial aspect of the arch.  There is no area pinpoint tenderness there is no edema, erythema.  Flexor extensor tendons appear to be intact.  Overall continues to improve.  No open lesions or pre-ulcerative lesions.  No pain with calf compression, swelling, warmth, erythema  Assessment: Concern for partial tear plantar fascia right side-with improvement; left side plantar fasciitis  Plan: -All treatment options discussed with the patient including all alternatives, risks, complications.  -She is continue to improve.  Discussed more supportive shoes, inserts as well as home physical therapy as she seems to be doing well with this.  Has not been able to complete formal physical therapy due to time constraints.  As she is tentatively follow-up with her as needed with any changes or worsening vomiting no. -Today's appointment was short as I was running late from surgery and she had to get back to work.   Apologies for the inconvenience.  No follow-ups on file.  Trula Slade DPM

## 2019-08-21 ENCOUNTER — Telehealth: Payer: Self-pay | Admitting: Family Medicine

## 2019-08-21 NOTE — Telephone Encounter (Signed)
Spoke to pt and she stated she will have to call back at a later time to schedule

## 2019-08-21 NOTE — Telephone Encounter (Signed)
-----   Message from Beverlee Nims sent at 08/21/2019  8:56 AM EDT ----- She has not followed as recommended, can you please arrange a f/u appt (if she is interested in doing so). Thanks, BJ

## 2020-01-28 ENCOUNTER — Ambulatory Visit: Payer: BC Managed Care – PPO | Attending: Family

## 2020-01-28 DIAGNOSIS — Z23 Encounter for immunization: Secondary | ICD-10-CM

## 2020-02-28 ENCOUNTER — Ambulatory Visit: Payer: BC Managed Care – PPO

## 2020-06-03 NOTE — Progress Notes (Signed)
   Covid-19 Vaccination Clinic  Name:  Taylor Yates    MRN: [id number] DOB: 1980-02-15  06/03/2020  [name] was observed post Covid-19 immunization for 15 minutes without incident. She was provided with Vaccine Information Sheet and instruction to access the V-Safe system.   [name] was instructed to call 911 with any severe reactions post vaccine: Marland Kitchen Difficulty breathing  . Swelling of face and throat  . A fast heartbeat  . A bad rash all over body  . Dizziness and weakness   Immunizations Administered    Name Date Dose VIS Date Route   Moderna Covid-19 Booster Vaccine 01/28/2020 10:45 AM 0.25 mL 11/27/2019 Intramuscular   Manufacturer: Moderna   Lot: [id number]   [place]: [id number]

## 2020-10-07 IMAGING — CT CT ABD-PELV W/ CM
2 of 5 series · 15 of 46 positions shown, 17 images · IV contrast (OMNIPAQUE)
Comparison: Abdominal ultrasound 06/16/2010; CT abdomen from
10/28/2006

CLINICAL DATA: 04/04/2019 colonoscopy showed extrinsic compression
along the cecum, query submucosal lesion

EXAM:
CT ABDOMEN AND PELVIS WITH CONTRAST
TECHNIQUE: Multidetector CT imaging of the abdomen and pelvis was performed
using the standard protocol following bolus administration of
intravenous contrast.
CONTRAST:  100mL OMNIPAQUE IOHEXOL 300 MG/ML  SOLN

[Series 2: axial st · axial · 0.87mm/px · z∈[+1065,+1465]mm · 12 of 96 slices shown, 14 images]
[im 8/96  soft-tissue]
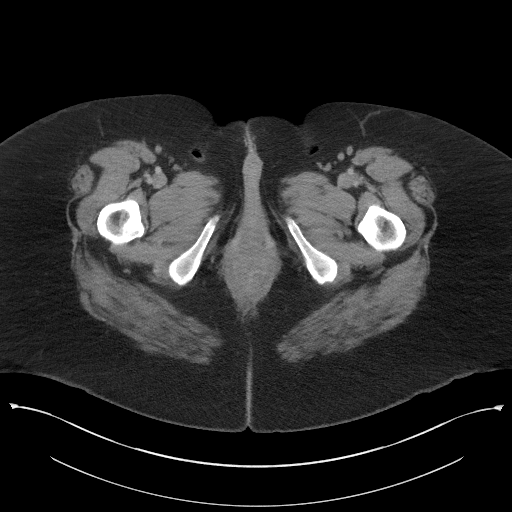
[im 8/96  bone]
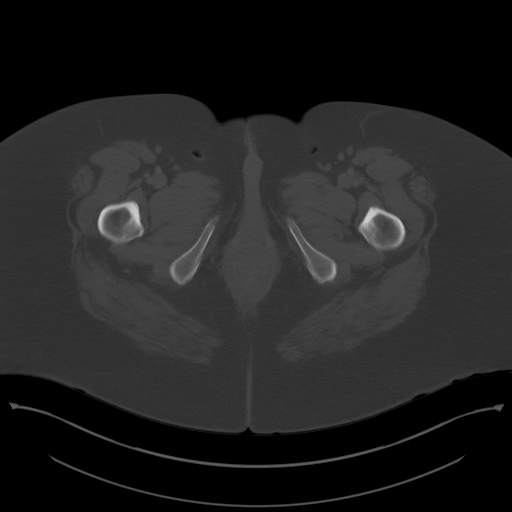
[im 15/96  soft-tissue]
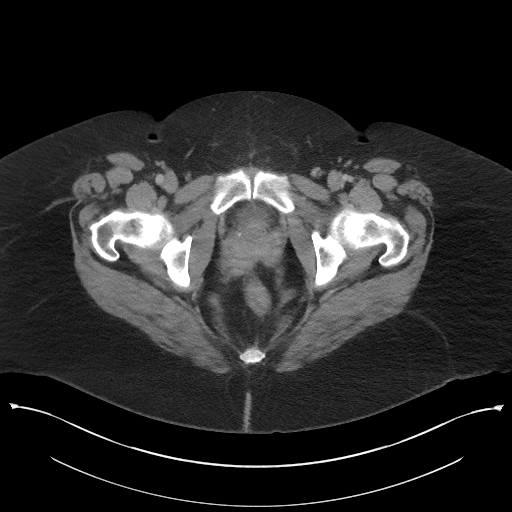
[im 22/96  soft-tissue]
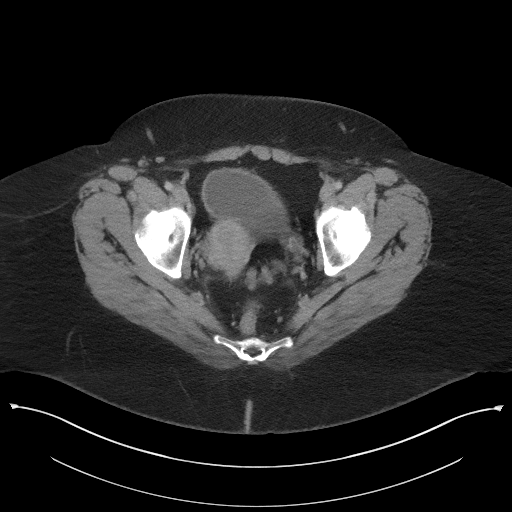
[im 30/96  soft-tissue]
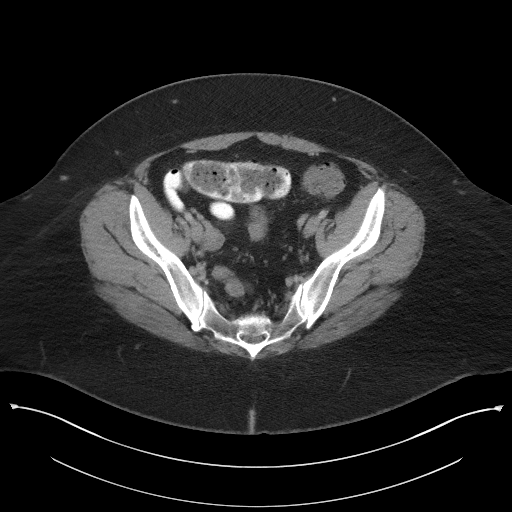
[im 37/96  soft-tissue]
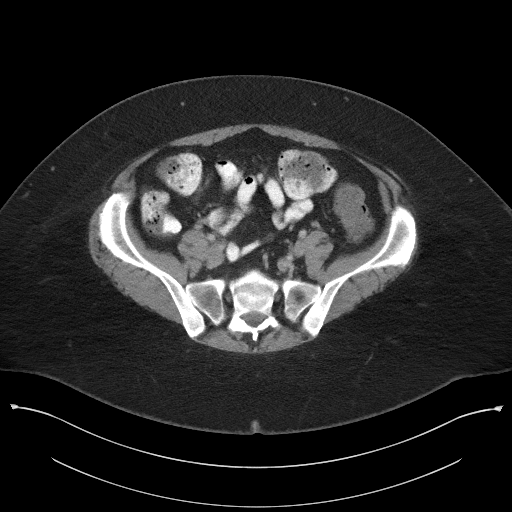
[im 44/96  soft-tissue]
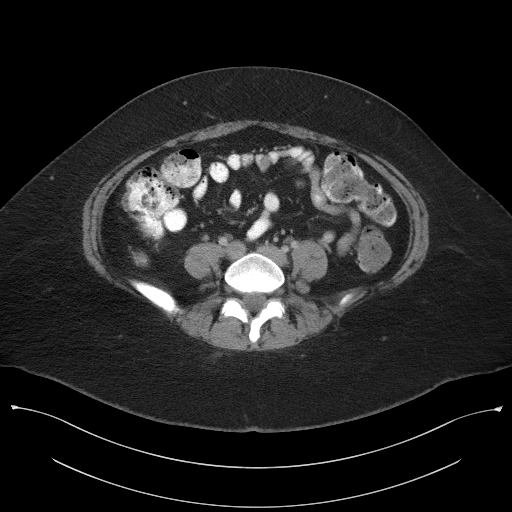
[im 52/96  soft-tissue]
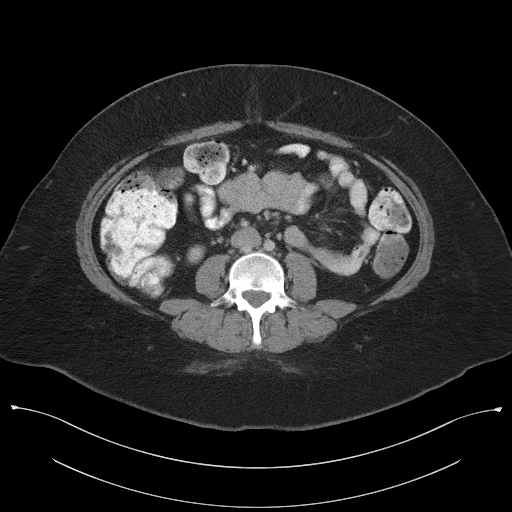
[im 59/96  soft-tissue]
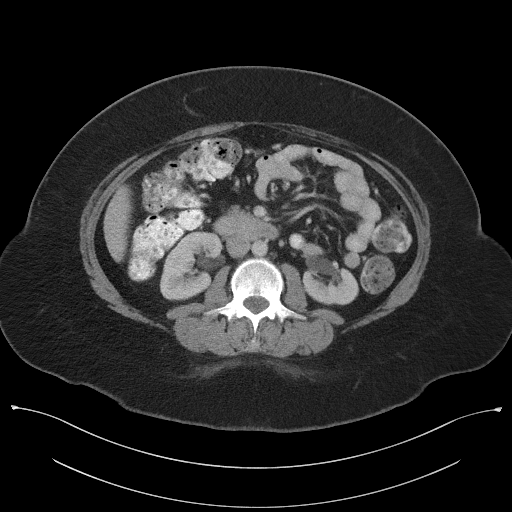
[im 66/96  soft-tissue]
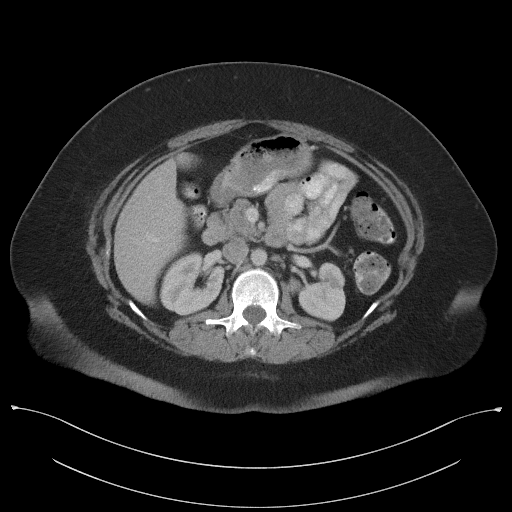
[im 66/96  bone]
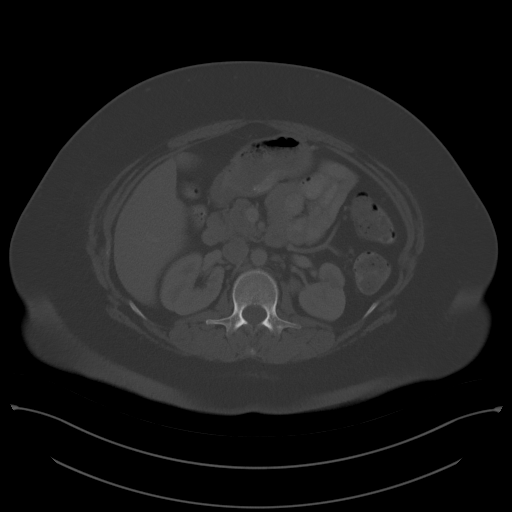
[im 74/96  soft-tissue]
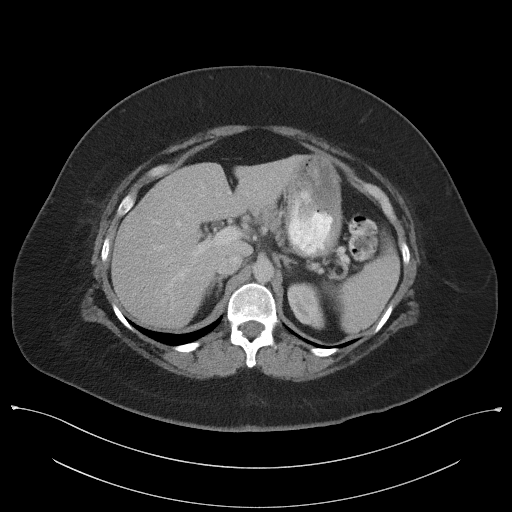
[im 81/96  soft-tissue]
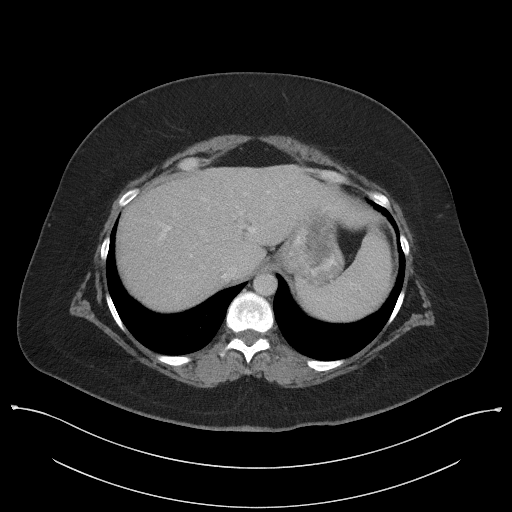
[im 88/96  soft-tissue]
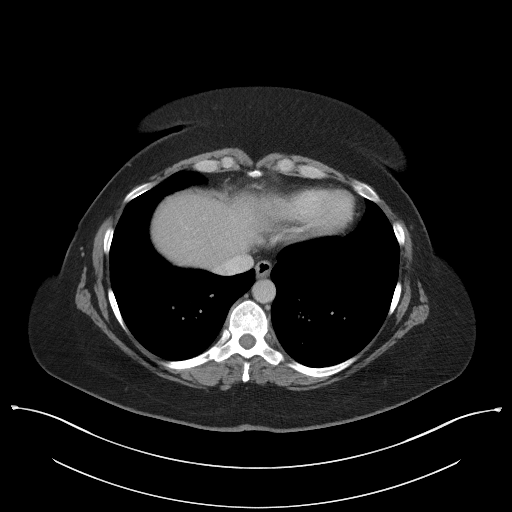

[Series 4: coronal st · coronal · 0.76mm/px · 3 of 114 slices shown]
[im 38/114  soft-tissue]
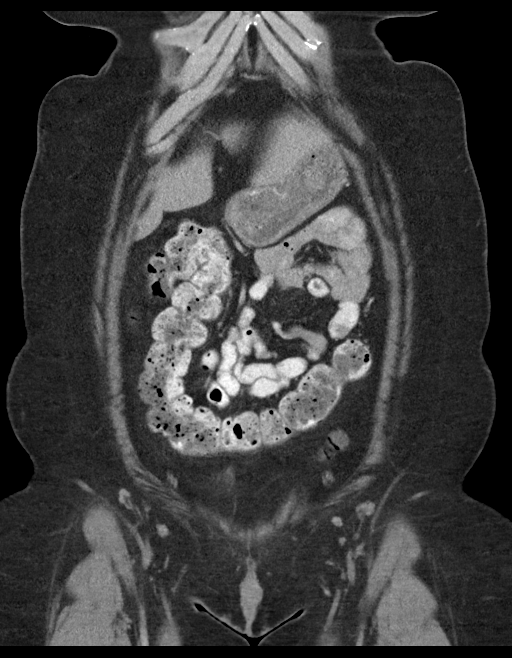
[im 51/114  soft-tissue]
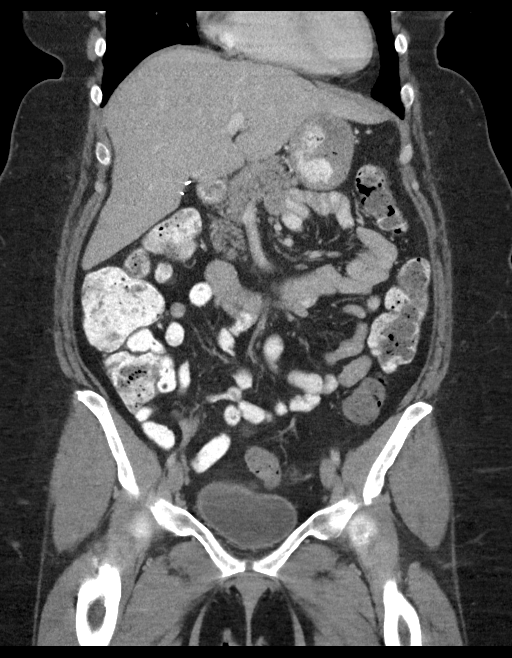
[im 63/114  soft-tissue]
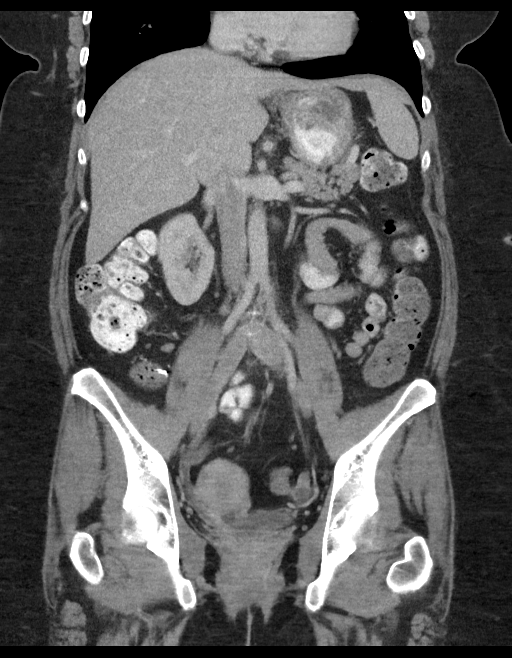

[15 of 46 positions shown; findings below may reference images not displayed]

FINDINGS: Lower chest: Mild cardiomegaly.

Hepatobiliary: Cholecystectomy.  Otherwise unremarkable.

Pancreas: Unremarkable

Spleen: Unremarkable

Adrenals/Urinary Tract: The adrenal glands appear normal.

Bilateral at least partially duplicated renal collecting systems.
Mild scarring in the left kidney similar to the prior exam. No
nephrolithiasis.

Stomach/Bowel: Appendectomy. Clips are noted posterior to the cecum
on images 55-56 of series 2. Portions of the distal ileum below the
ileocecal valve extending immediately adjacent to the cecum for
example on images 52 through 60 of series 2 and as shown on image
61/4. No other lesion is observed along the cecum to further explain
the focal smooth convexity seen at endoscopy.

Vascular/Lymphatic: There is a congenital venous variant of the
lower IVC, in which the IVC passes anterior to the right common
iliac artery/aortic bifurcation (pre-aortic iliac confluence). This
is illustrated for example on image 43/2, and is typically only
considered clinically relevant if the patient were to need lower
aortic surgery in the future.

Reproductive: Suspected left anterior fundal fibroid, image 73/2,
measuring about 1.9 cm in diameter.

Other: No supplemental non-categorized findings.

Musculoskeletal: Unremarkable
IMPRESSION: 1. A definite cause for the focal bulge along the cecum seen at
colonoscopy is not identified. Possible candidates based on today's
imaging include the clips from prior appendectomy, and potentially a
close association between the lower distal ileum and the cecum. No
separate submucosal lesion is appreciated.
2. Bilateral duplicated renal collecting systems. Mild scarring in
the left mid kidney, similar to the 10/28/2006 CT exam.
3. Congenital venous variant in the lower IVC (pre-aortic iliac
confluence). This type of congenital venous variant is almost
universally asymptomatic, and is only relevant clinically as an item
of note if the patient were to need lower aortic surgery in the
future.
4. Suspected small left anterior uterine fundal fibroid.
5. Mild cardiomegaly.

## 2020-11-21 ENCOUNTER — Other Ambulatory Visit: Payer: Self-pay

## 2020-11-21 ENCOUNTER — Encounter (HOSPITAL_BASED_OUTPATIENT_CLINIC_OR_DEPARTMENT_OTHER): Payer: Self-pay | Admitting: Emergency Medicine

## 2020-11-21 ENCOUNTER — Emergency Department (HOSPITAL_BASED_OUTPATIENT_CLINIC_OR_DEPARTMENT_OTHER)
Admission: EM | Admit: 2020-11-21 | Discharge: 2020-11-21 | Disposition: A | Discharge status: Home / Self Care | Payer: BC Managed Care – PPO | Attending: Emergency Medicine | Admitting: Emergency Medicine

## 2020-11-21 DIAGNOSIS — F0781 Postconcussional syndrome: Secondary | ICD-10-CM | POA: Diagnosis not present

## 2020-11-21 DIAGNOSIS — R413 Other amnesia: Secondary | ICD-10-CM | POA: Insufficient documentation

## 2020-11-21 DIAGNOSIS — R519 Headache, unspecified: Secondary | ICD-10-CM | POA: Diagnosis not present
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
## 2021-09-30 ENCOUNTER — Encounter: Payer: Self-pay | Admitting: Emergency Medicine

## 2021-09-30 ENCOUNTER — Ambulatory Visit
Admission: EM | Admit: 2021-09-30 | Discharge: 2021-09-30 | Disposition: A | Discharge status: Home / Self Care | Payer: BC Managed Care – PPO | Attending: Urgent Care | Admitting: Urgent Care

## 2021-09-30 ENCOUNTER — Ambulatory Visit: Admit: 2021-09-30 | Payer: BC Managed Care – PPO

## 2021-09-30 DIAGNOSIS — Z20822 Contact with and (suspected) exposure to covid-19: Secondary | ICD-10-CM | POA: Diagnosis not present

## 2021-09-30 DIAGNOSIS — R070 Pain in throat: Secondary | ICD-10-CM | POA: Diagnosis not present

## 2021-09-30 DIAGNOSIS — J069 Acute upper respiratory infection, unspecified: Secondary | ICD-10-CM

## 2021-09-30 DIAGNOSIS — R0981 Nasal congestion: Secondary | ICD-10-CM

## 2021-09-30 DIAGNOSIS — R519 Headache, unspecified: Secondary | ICD-10-CM

## 2021-09-30 MED ORDER — PSEUDOEPHEDRINE HCL 60 MG PO TABS: 60.0000 mg | ORAL_TABLET | Freq: Three times a day (TID) | ORAL | 0 refills | Status: DC | PRN

## 2021-09-30 MED ORDER — CETIRIZINE HCL 10 MG PO TABS: 10.0000 mg | ORAL_TABLET | Freq: Every day | ORAL | 0 refills | Status: DC

## 2021-09-30 NOTE — Discharge Instructions (Signed)
We will notify you of your test results as they arrive and may take between 24-48 hours.  I encourage you to sign up for MyChart if you have not already done so as this can be the easiest way for [place] to communicate results to you online or through a phone app.  Generally, we only contact you if it is a positive test result.  In the meantime, if you develop worsening symptoms including fever, chest pain, shortness of breath despite our current treatment plan then please report to the emergency room as this may be a sign of worsening status from possible viral infection.  Otherwise, we will manage this as a viral syndrome. For sore throat or cough try using a honey-based tea. Use 3 teaspoons of honey with juice squeezed from half lemon. Place shaved pieces of ginger into 1/2-1 cup of water and warm over stove top. Then mix the ingredients and repeat every 4 hours as needed. Please take Tylenol '500mg'$ -'650mg'$  every 6 hours for aches and pains, fevers. Hydrate very well with at least 2 liters of water. Eat light meals such as soups to replenish electrolytes and soft fruits, veggies. Start an antihistamine like Zyrtec for postnasal drainage, sinus congestion.  You can take this together with pseudoephedrine (Sudafed) at a dose of 60 mg 2-3 times a day as needed for the same kind of congestion.  Use the cough medications as needed.

## 2021-09-30 NOTE — ED Triage Notes (Signed)
Pt had dental work yesterday and then woke up this morning with sinus pressure, headache, sore throat and congestion.
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
## 2021-10-01 LAB — SARS CORONAVIRUS 2 (TAT 6-24 HRS): SARS Coronavirus 2: NEGATIVE

## 2021-11-25 ENCOUNTER — Encounter (HOSPITAL_BASED_OUTPATIENT_CLINIC_OR_DEPARTMENT_OTHER): Payer: Self-pay | Admitting: Obstetrics and Gynecology

## 2021-11-25 NOTE — Progress Notes (Signed)
Spoke w/ via phone for pre-op interview---[name] Lab needs dos----  UPT             Lab results------ COVID test -----patient states asymptomatic no test needed Arrive at -------1045 NPO after MN NO Solid Food.  Clear liquids from MN until---0945 Med rec completed Medications to take morning of surgery -----thyroid med Diabetic medication -----N/A Patient instructed no nail polish to be worn day of surgery Patient instructed to bring photo id and insurance card day of surgery Patient aware to have Driver (ride ) / caregiver    for 24 hours after surgery Taylor Yates, (mom) Patient Special Instructions ----- Pre-Op special Istructions ----- Patient verbalized understanding of instructions that were given at this phone interview. Patient denies shortness of breath, chest pain, fever, cough at this phone interview.

## 2021-12-02 ENCOUNTER — Other Ambulatory Visit: Payer: Self-pay | Admitting: Obstetrics and Gynecology

## 2021-12-02 NOTE — Progress Notes (Signed)
Pt returning call regarding time change for procedure tomorrow, 12/03/21. Pt to arrive at 0900, clear liquids until 0800, NPO for solids at midnight. Reviewed location of surgery center. Pt verbalizes understanding and states no further questions at this time.

## 2021-12-03 ENCOUNTER — Encounter (HOSPITAL_BASED_OUTPATIENT_CLINIC_OR_DEPARTMENT_OTHER): Payer: Self-pay | Admitting: Obstetrics and Gynecology

## 2021-12-03 ENCOUNTER — Ambulatory Visit (HOSPITAL_BASED_OUTPATIENT_CLINIC_OR_DEPARTMENT_OTHER)
Admission: RE | Admit: 2021-12-03 | Discharge: 2021-12-03 | Disposition: A | Discharge status: Home / Self Care | Payer: BC Managed Care – PPO | Source: Ambulatory Visit | Attending: Obstetrics and Gynecology | Admitting: Obstetrics and Gynecology

## 2021-12-03 ENCOUNTER — Encounter (HOSPITAL_BASED_OUTPATIENT_CLINIC_OR_DEPARTMENT_OTHER)
Admission: RE | Disposition: A | Discharge status: Home / Self Care | Payer: Self-pay | Source: Ambulatory Visit | Attending: Obstetrics and Gynecology

## 2021-12-03 ENCOUNTER — Ambulatory Visit (HOSPITAL_BASED_OUTPATIENT_CLINIC_OR_DEPARTMENT_OTHER): Payer: BC Managed Care – PPO | Admitting: Certified Registered Nurse Anesthetist

## 2021-12-03 DIAGNOSIS — R102 Pelvic and perineal pain: Secondary | ICD-10-CM | POA: Diagnosis present

## 2021-12-03 DIAGNOSIS — N711 Chronic inflammatory disease of uterus: Secondary | ICD-10-CM | POA: Diagnosis not present

## 2021-12-03 DIAGNOSIS — D252 Subserosal leiomyoma of uterus: Secondary | ICD-10-CM | POA: Insufficient documentation

## 2021-12-03 DIAGNOSIS — N946 Dysmenorrhea, unspecified: Secondary | ICD-10-CM | POA: Diagnosis not present

## 2021-12-03 DIAGNOSIS — N920 Excessive and frequent menstruation with regular cycle: Secondary | ICD-10-CM | POA: Diagnosis not present

## 2021-12-03 DIAGNOSIS — N803 Endometriosis of pelvic peritoneum, unspecified: Secondary | ICD-10-CM | POA: Insufficient documentation

## 2021-12-03 DIAGNOSIS — N840 Polyp of corpus uteri: Secondary | ICD-10-CM | POA: Diagnosis not present

## 2021-12-03 DIAGNOSIS — Z9049 Acquired absence of other specified parts of digestive tract: Secondary | ICD-10-CM | POA: Diagnosis not present

## 2021-12-03 DIAGNOSIS — Z01818 Encounter for other preprocedural examination: Secondary | ICD-10-CM

## 2021-12-03 HISTORY — PX: XI ROBOT ASSISTED DIAGNOSTIC LAPAROSCOPY: SHX6815

## 2021-12-03 HISTORY — DX: Hypothyroidism, unspecified: E03.9

## 2021-12-03 HISTORY — PX: DILATATION & CURETTAGE/HYSTEROSCOPY WITH MYOSURE: SHX6511

## 2021-12-03 LAB — CBC
HCT: 40.2 % (ref 36.0–46.0)
Hemoglobin: 12.8 g/dL (ref 12.0–15.0)
MCH: 28.1 pg (ref 26.0–34.0)
MCHC: 31.8 g/dL (ref 30.0–36.0)
MCV: 88.2 fL (ref 80.0–100.0)
Platelets: 289 10*3/uL (ref 150–400)
RBC: 4.56 MIL/uL (ref 3.87–5.11)
RDW: 14.9 % (ref 11.5–15.5)
WBC: 7.3 10*3/uL (ref 4.0–10.5)
nRBC: 0 % (ref 0.0–0.2)

## 2021-12-03 LAB — POCT PREGNANCY, URINE: Preg Test, Ur: NEGATIVE

## 2021-12-03 SURGERY — LAPAROSCOPY, DIAGNOSTIC, ROBOT-ASSISTED
Anesthesia: General | Site: Uterus

## 2021-12-03 MED ORDER — PROPOFOL 10 MG/ML IV BOLUS
INTRAVENOUS | Status: AC
  Filled 2021-12-03: qty 20

## 2021-12-03 MED ORDER — ROCURONIUM BROMIDE 10 MG/ML (PF) SYRINGE
PREFILLED_SYRINGE | INTRAVENOUS | Status: DC | PRN
  Administered 2021-12-03: 20 mg via INTRAVENOUS
  Administered 2021-12-03: 50 mg via INTRAVENOUS

## 2021-12-03 MED ORDER — ONDANSETRON HCL 4 MG/2ML IJ SOLN
INTRAMUSCULAR | Status: DC | PRN
  Administered 2021-12-03: 4 mg via INTRAVENOUS

## 2021-12-03 MED ORDER — KETOROLAC TROMETHAMINE 30 MG/ML IJ SOLN
INTRAMUSCULAR | Status: DC | PRN
  Administered 2021-12-03: 30 mg via INTRAVENOUS

## 2021-12-03 MED ORDER — MIDAZOLAM HCL 2 MG/2ML IJ SOLN
INTRAMUSCULAR | Status: DC | PRN
  Administered 2021-12-03: 2 mg via INTRAVENOUS

## 2021-12-03 MED ORDER — EPHEDRINE SULFATE-NACL 50-0.9 MG/10ML-% IV SOSY
PREFILLED_SYRINGE | INTRAVENOUS | Status: DC | PRN
  Administered 2021-12-03 (×2): 5 mg via INTRAVENOUS

## 2021-12-03 MED ORDER — HEMOSTATIC AGENTS (NO CHARGE) OPTIME
TOPICAL | Status: DC | PRN
  Administered 2021-12-03: 1 via TOPICAL

## 2021-12-03 MED ORDER — FENTANYL CITRATE (PF) 250 MCG/5ML IJ SOLN
INTRAMUSCULAR | Status: AC
  Filled 2021-12-03: qty 5

## 2021-12-03 MED ORDER — TRAMADOL HCL 50 MG PO TABS
50.0000 mg | ORAL_TABLET | Freq: Once | ORAL | Status: AC
  Administered 2021-12-03: 50 mg via ORAL

## 2021-12-03 MED ORDER — HYDROMORPHONE HCL 1 MG/ML IJ SOLN
INTRAMUSCULAR | Status: AC
  Filled 2021-12-03: qty 1

## 2021-12-03 MED ORDER — HYDROMORPHONE HCL 2 MG PO TABS: 2.0000 mg | ORAL_TABLET | Freq: Four times a day (QID) | ORAL | 0 refills | Status: AC | PRN

## 2021-12-03 MED ORDER — BUPIVACAINE HCL (PF) 0.25 % IJ SOLN
INTRAMUSCULAR | Status: DC | PRN
  Administered 2021-12-03: 10 mL

## 2021-12-03 MED ORDER — SUGAMMADEX SODIUM 200 MG/2ML IV SOLN
INTRAVENOUS | Status: DC | PRN
  Administered 2021-12-03: 400 mg via INTRAVENOUS

## 2021-12-03 MED ORDER — SODIUM CHLORIDE 0.9 % IR SOLN
Status: DC | PRN
  Administered 2021-12-03: 3000 mL
  Administered 2021-12-03: 300 mL

## 2021-12-03 MED ORDER — MIDAZOLAM HCL 2 MG/2ML IJ SOLN
INTRAMUSCULAR | Status: AC
  Filled 2021-12-03: qty 2

## 2021-12-03 MED ORDER — LIDOCAINE 2% (20 MG/ML) 5 ML SYRINGE
INTRAMUSCULAR | Status: DC | PRN
  Administered 2021-12-03: 100 mg via INTRAVENOUS

## 2021-12-03 MED ORDER — PROPOFOL 10 MG/ML IV BOLUS
INTRAVENOUS | Status: DC | PRN
  Administered 2021-12-03: 160 mg via INTRAVENOUS

## 2021-12-03 MED ORDER — STERILE WATER FOR IRRIGATION IR SOLN
Status: DC | PRN
  Administered 2021-12-03: 500 mL

## 2021-12-03 MED ORDER — SUCCINYLCHOLINE CHLORIDE 200 MG/10ML IV SOSY
PREFILLED_SYRINGE | INTRAVENOUS | Status: DC | PRN
  Administered 2021-12-03: 140 mg via INTRAVENOUS

## 2021-12-03 MED ORDER — FENTANYL CITRATE (PF) 250 MCG/5ML IJ SOLN
INTRAMUSCULAR | Status: DC | PRN
  Administered 2021-12-03: 25 ug via INTRAVENOUS
  Administered 2021-12-03: 150 ug via INTRAVENOUS
  Administered 2021-12-03: 25 ug via INTRAVENOUS
  Administered 2021-12-03: 50 ug via INTRAVENOUS

## 2021-12-03 MED ORDER — TRAMADOL HCL 50 MG PO TABS
ORAL_TABLET | ORAL | Status: AC
  Filled 2021-12-03: qty 1

## 2021-12-03 MED ORDER — LACTATED RINGERS IV SOLN: INTRAVENOUS | Status: DC

## 2021-12-03 MED ORDER — HYDROMORPHONE HCL 1 MG/ML IJ SOLN
0.2500 mg | INTRAMUSCULAR | Status: DC | PRN
  Administered 2021-12-03 (×2): 0.25 mg via INTRAVENOUS

## 2021-12-03 MED ORDER — PHENYLEPHRINE 80 MCG/ML (10ML) SYRINGE FOR IV PUSH (FOR BLOOD PRESSURE SUPPORT)
PREFILLED_SYRINGE | INTRAVENOUS | Status: DC | PRN
  Administered 2021-12-03 (×2): 80 ug via INTRAVENOUS
  Administered 2021-12-03 (×2): 160 ug via INTRAVENOUS

## 2021-12-03 MED ORDER — DIPHENHYDRAMINE HCL 50 MG/ML IJ SOLN
INTRAMUSCULAR | Status: DC | PRN
  Administered 2021-12-03: 12.5 mg via INTRAVENOUS

## 2021-12-03 MED ORDER — POVIDONE-IODINE 10 % EX SWAB: 2.0000 | Freq: Once | CUTANEOUS | Status: DC

## 2021-12-03 SURGICAL SUPPLY — 91 items
ADH SKN CLS APL DERMABOND .7 (GAUZE/BANDAGES/DRESSINGS) ×3
APL SRG 38 LTWT LNG FL B (MISCELLANEOUS) ×3
APL SWBSTK 6 STRL LF DISP (MISCELLANEOUS) ×3
APPLICATOR ARISTA FLEXITIP XL (MISCELLANEOUS) ×1 IMPLANT
APPLICATOR COTTON TIP 6 STRL (MISCELLANEOUS) ×4 IMPLANT
APPLICATOR COTTON TIP 6IN STRL (MISCELLANEOUS) ×3
CABLE HIGH FREQUENCY MONO STRZ (ELECTRODE) IMPLANT
CATH FOLEY 3WAY  5CC 16FR (CATHETERS) ×3
CATH FOLEY 3WAY 5CC 16FR (CATHETERS) ×4 IMPLANT
COVER BACK TABLE 60X90IN (DRAPES) ×4 IMPLANT
COVER MAYO STAND STRL (DRAPES) ×4 IMPLANT
COVER TIP SHEARS 8 DVNC (MISCELLANEOUS) ×5 IMPLANT
COVER TIP SHEARS 8MM DA VINCI (MISCELLANEOUS) ×6
DEFOGGER SCOPE WARMER CLEARIFY (MISCELLANEOUS) ×4 IMPLANT
DERMABOND ADVANCED .7 DNX12 (GAUZE/BANDAGES/DRESSINGS) ×4 IMPLANT
DEVICE MYOSURE LITE (MISCELLANEOUS) ×1 IMPLANT
DRAPE ARM DVNC X/XI (DISPOSABLE) ×16 IMPLANT
DRAPE COLUMN DVNC XI (DISPOSABLE) ×4 IMPLANT
DRAPE DA VINCI XI ARM (DISPOSABLE) ×12
DRAPE DA VINCI XI COLUMN (DISPOSABLE) ×3
DRAPE UTILITY XL STRL (DRAPES) ×4 IMPLANT
DRSG OPSITE POSTOP 3X4 (GAUZE/BANDAGES/DRESSINGS) IMPLANT
DRSG TELFA 3X8 NADH STRL (GAUZE/BANDAGES/DRESSINGS) ×1 IMPLANT
DURAPREP 26ML APPLICATOR (WOUND CARE) ×4 IMPLANT
ELECT REM PT RETURN 9FT ADLT (ELECTROSURGICAL) ×3
ELECTRODE REM PT RTRN 9FT ADLT (ELECTROSURGICAL) ×4 IMPLANT
GAUZE 4X4 16PLY ~~LOC~~+RFID DBL (SPONGE) ×7 IMPLANT
GLOVE BIOGEL PI IND STRL 7.0 (GLOVE) ×20 IMPLANT
GLOVE ECLIPSE 6.5 STRL STRAW (GLOVE) ×12 IMPLANT
GOWN STRL REUS W/TWL LRG LVL3 (GOWN DISPOSABLE) ×12 IMPLANT
HEMOSTAT ARISTA ABSORB 3G PWDR (HEMOSTASIS) ×1 IMPLANT
HOLDER FOLEY CATH W/STRAP (MISCELLANEOUS) IMPLANT
IRRIG SUCT STRYKERFLOW 2 WTIP (MISCELLANEOUS) ×3
IRRIGATION SUCT STRKRFLW 2 WTP (MISCELLANEOUS) ×4 IMPLANT
IV NS 1000ML (IV SOLUTION) ×3
IV NS 1000ML BAXH (IV SOLUTION) ×1 IMPLANT
IV NS IRRIG 3000ML ARTHROMATIC (IV SOLUTION) ×4 IMPLANT
KIT PROCEDURE FLUENT (KITS) ×4 IMPLANT
KIT TURNOVER CYSTO (KITS) ×4 IMPLANT
LEGGING LITHOTOMY PAIR STRL (DRAPES) ×4 IMPLANT
LIGASURE BLUNT TIP 5 LONG 44CM (ELECTROSURGICAL) IMPLANT
MYOSURE XL FIBROID (MISCELLANEOUS)
NDL INSUFFLATION 14GA 120MM (NEEDLE) ×3 IMPLANT
NEEDLE INSUFFLATION 14GA 120MM (NEEDLE) ×3 IMPLANT
OBTURATOR OPTICAL STANDARD 8MM (TROCAR) ×3
OBTURATOR OPTICAL STND 8 DVNC (TROCAR) ×3
OBTURATOR OPTICALSTD 8 DVNC (TROCAR) ×4 IMPLANT
OCCLUDER COLPOPNEUMO (BALLOONS) ×3 IMPLANT
PACK LAPAROSCOPY BASIN (CUSTOM PROCEDURE TRAY) ×4 IMPLANT
PACK ROBOT WH (CUSTOM PROCEDURE TRAY) ×4 IMPLANT
PACK ROBOTIC GOWN (GOWN DISPOSABLE) ×4 IMPLANT
PACK TRENDGUARD 450 HYBRID PRO (MISCELLANEOUS) ×4 IMPLANT
PACK VAGINAL MINOR WOMEN LF (CUSTOM PROCEDURE TRAY) ×4 IMPLANT
PAD OB MATERNITY 4.3X12.25 (PERSONAL CARE ITEMS) ×4 IMPLANT
PAD PREP 24X48 CUFFED NSTRL (MISCELLANEOUS) ×4 IMPLANT
PROTECTOR NERVE ULNAR (MISCELLANEOUS) ×8 IMPLANT
SCISSORS LAP 5X35 DISP (ENDOMECHANICALS) IMPLANT
SEAL CANN UNIV 5-8 DVNC XI (MISCELLANEOUS) ×15 IMPLANT
SEAL CERVICAL OMNI LOK (ABLATOR) IMPLANT
SEAL ROD LENS SCOPE MYOSURE (ABLATOR) ×4 IMPLANT
SEAL XI 5MM-8MM UNIVERSAL (MISCELLANEOUS) ×9
SEALER VESSEL DA VINCI XI (MISCELLANEOUS)
SEALER VESSEL EXT DVNC XI (MISCELLANEOUS) ×3 IMPLANT
SET IRRIG Y TYPE TUR BLADDER L (SET/KITS/TRAYS/PACK) IMPLANT
SET SUCTION IRRIG HYDROSURG (IRRIGATION / IRRIGATOR) IMPLANT
SET TRI-LUMEN FLTR TB AIRSEAL (TUBING) ×4 IMPLANT
SET TUBE SMOKE EVAC HIGH FLOW (TUBING) ×4 IMPLANT
SLEEVE Z-THREAD 5X100MM (TROCAR) ×4 IMPLANT
SOLUTION ELECTROLUBE (MISCELLANEOUS) IMPLANT
SPIKE FLUID TRANSFER (MISCELLANEOUS) ×4 IMPLANT
SPONGE T-LAP 4X18 ~~LOC~~+RFID (SPONGE) ×4 IMPLANT
SUT VIC AB 0 CT1 36 (SUTURE) ×8 IMPLANT
SUT VIC AB 4-0 PS2 18 (SUTURE) ×8 IMPLANT
SUT VICRYL 0 UR6 27IN ABS (SUTURE) ×4 IMPLANT
SUT VLOC 180 0 9IN  GS21 (SUTURE)
SUT VLOC 180 0 9IN GS21 (SUTURE) ×3 IMPLANT
SYS BAG RETRIEVAL 10MM (BASKET)
SYSTEM BAG RETRIEVAL 10MM (BASKET) IMPLANT
SYSTEM TISS REMOVAL MYOSURE XL (MISCELLANEOUS) IMPLANT
TIP UTERINE 6.7X6CM WHT DISP (MISCELLANEOUS) ×1 IMPLANT
TOWEL OR 17X24 6PK STRL BLUE (TOWEL DISPOSABLE) ×1 IMPLANT
TOWEL OR 17X26 10 PK STRL BLUE (TOWEL DISPOSABLE) ×6 IMPLANT
TRENDGUARD 450 HYBRID PRO PACK (MISCELLANEOUS) ×3
TROCAR BALLN 12MMX100 BLUNT (TROCAR) IMPLANT
TROCAR PORT AIRSEAL 5X120 (TROCAR) ×1 IMPLANT
TROCAR PORT AIRSEAL 8X120 (TROCAR) ×3 IMPLANT
TROCAR Z-THREAD BLADED 11X100M (TROCAR) ×4 IMPLANT
TROCAR Z-THREAD BLADED 5X100MM (TROCAR) ×4 IMPLANT
WARMER LAPAROSCOPE (MISCELLANEOUS) ×4 IMPLANT
WATER STERILE IRR 1000ML POUR (IV SOLUTION) ×3 IMPLANT
WATER STERILE IRR 500ML POUR (IV SOLUTION) ×4 IMPLANT

## 2021-12-03 NOTE — Anesthesia Procedure Notes (Addendum)
Procedure Name: Intubation Date/Time: 12/03/2021 11:01 AM  Performed by: Clearnce Sorrel, CRNAPre-anesthesia Checklist: Patient identified, Emergency Drugs available, Suction available and Patient being monitored Patient Re-evaluated:Patient Re-evaluated prior to induction Oxygen Delivery Method: Circle System Utilized Preoxygenation: Pre-oxygenation with 100% oxygen Induction Type: IV induction Ventilation: Mask ventilation without difficulty Laryngoscope Size: Mac and 3 Tube type: Oral Tube size: 7.0 mm Number of attempts: 1 Airway Equipment and Method: Stylet and Oral airway Placement Confirmation: ETT inserted through vocal cords under direct vision, positive ETCO2 and breath sounds checked- equal and bilateral Secured at: 21 cm Tube secured with: Tape Dental Injury: Teeth and Oropharynx as per pre-operative assessment

## 2021-12-03 NOTE — Anesthesia Postprocedure Evaluation (Signed)
Anesthesia Post Note  Patient: Taylor Yates  Procedure(s) Performed: XI ROBOT ASSISTED DIAGNOSTIC LAPAROSCOPY; RESECTION OF ENDOMETRIOSIS (Abdomen) DILATATION & CURETTAGE/ DIAGNOSTIC HYSTEROSCOPY WITH MYOSURE (Uterus)     Patient location during evaluation: PACU Anesthesia Type: General Level of consciousness: awake Pain management: pain level controlled Vital Signs Assessment: post-procedure vital signs reviewed and stable Respiratory status: spontaneous breathing Cardiovascular status: stable Postop Assessment: no apparent nausea or vomiting Anesthetic complications: no   No notable events documented.  Last Vitals:  Vitals:   12/03/21 1330 12/03/21 1424  BP: 120/79 127/67  Pulse: 70 68  Resp: 12 14  Temp:  36.6 C  SpO2: 94% 100%    Last Pain:  Vitals:   12/03/21 1424  TempSrc:   PainSc: 4                  Caddie Randle

## 2021-12-03 NOTE — Brief Op Note (Signed)
12/03/2021  1:19 PM  PATIENT:  Taylor Yates  41 y.o. 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 Multiple allen-master''s  windows, burnt out scarring endometriosis, nl ureters, nl ovaries, nl tubes. Right tubal ostia seen, left not seen well, anterior endometrial polyp, nl endocervical canal EBL:  10 mL   BLOOD ADMINISTERED:none  DRAINS: none   LOCAL MEDICATIONS USED:  MARCAINE     SPECIMEN:  Source of Specimen:  peritoneal biopsies endometriosis  DISPOSITION OF SPECIMEN:  PATHOLOGY  COUNTS:  YES  TOURNIQUET:  * No tourniquets in log *  DICTATION: .Other Dictation: Dictation Number [id number]  PLAN OF CARE: Discharge to home after PACU  PATIENT DISPOSITION:  PACU - hemodynamically stable.   Delay start of Pharmacological VTE agent (>24hrs) due to surgical blood loss or risk of bleeding: no

## 2021-12-03 NOTE — Anesthesia Preprocedure Evaluation (Signed)
Anesthesia Evaluation  Patient identified by MRN, date of birth, ID band Patient awake    Reviewed: Allergy & Precautions, NPO status , Patient's Chart, lab work & pertinent test results  Airway Mallampati: II       Dental   Pulmonary    breath sounds clear to auscultation       Cardiovascular  Rhythm:Regular Rate:Normal  History noted Dr. Nyoka Cowden   Neuro/Psych  Headaches,  Neuromuscular disease    GI/Hepatic Neg liver ROS, hiatal hernia, GERD  ,  Endo/Other  Hypothyroidism   Renal/GU negative Renal ROS     Musculoskeletal   Abdominal   Peds  Hematology   Anesthesia Other Findings   Reproductive/Obstetrics                             Anesthesia Physical Anesthesia Plan  ASA: 3  Anesthesia Plan: General   Post-op Pain Management:    Induction: Intravenous  PONV Risk Score and Plan: Ondansetron and Midazolam  Airway Management Planned: Oral ETT  Additional Equipment:   Intra-op Plan:   Post-operative Plan: Extubation in OR  Informed Consent: I have reviewed the patients History and Physical, chart, labs and discussed the procedure including the risks, benefits and alternatives for the proposed anesthesia with the patient or authorized representative who has indicated his/her understanding and acceptance.     Dental advisory given  Plan Discussed with: CRNA and Anesthesiologist  Anesthesia Plan Comments:         Anesthesia Quick Evaluation

## 2021-12-03 NOTE — Transfer of Care (Signed)
Immediate Anesthesia Transfer of Care Note  Patient: Taylor Yates  Procedure(s) Performed: XI ROBOT ASSISTED DIAGNOSTIC LAPAROSCOPY; RESECTION OF ENDOMETRIOSIS (Abdomen) DILATATION & CURETTAGE/ DIAGNOSTIC HYSTEROSCOPY WITH MYOSURE (Uterus) LAPAROSCOPY DIAGNOSTIC (Abdomen)  Patient Location: PACU  Anesthesia Type:General  Level of Consciousness: awake, alert  and oriented  Airway & Oxygen Therapy: Patient Spontanous Breathing  Post-op Assessment: Report given to RN and Post -op Vital signs reviewed and stable  Post vital signs: Reviewed and stable  Last Vitals:  Vitals Value Taken Time  BP 116/66 12/03/21 1245  Temp    Pulse 80 12/03/21 1247  Resp 15 12/03/21 1247  SpO2 97 % 12/03/21 1247  Vitals shown include unvalidated device data.  Last Pain:  Vitals:   12/03/21 0917  TempSrc: Oral  PainSc: 4       Patients Stated Pain Goal: 4 (09/81/19 1478)  Complications: No notable events documented.
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
## 2021-12-03 NOTE — Interval H&P Note (Signed)
History and Physical Interval Note:  12/03/2021 9:18 AM  Taylor Yates  has presented today for surgery, with the diagnosis of dysmenorrhea menorrhagia.  The various methods of treatment have been discussed with the patient and family. After consideration of risks, benefits and other options for treatment, the patient has consented to  Procedure(s): POSSIBLE XI ROBOT ASSISTED DIAGNOSTIC LAPAROSCOPY (N/A) DILATATION & CURETTAGE/HYSTEROSCOPY WITH MYOSURE (N/A) LAPAROSCOPY DIAGNOSTIC (N/A) as a surgical intervention.  The patient's history has been reviewed, patient examined, no change in status, stable for surgery.  I have reviewed the patient's chart and labs.  Questions were answered to the patient's satisfaction.     Taylor Yates A Jasmin Winberry

## 2021-12-03 NOTE — Discharge Instructions (Addendum)
CALL  IF TEMP>100.4, NOTHING PER VAGINA X 2 WK, CALL IF SOAKING A MAXI  PAD EVERY HOUR OR MORE FREQUENTLY Warm heat to abdomen  every 4 hours x 24 hrs   Post Anesthesia Home Care Instructions  Activity: Get plenty of rest for the remainder of the day. A responsible individual must stay with you for 24 hours following the procedure.  For the next 24 hours, DO NOT: -Drive a car -Paediatric nurse -Drink alcoholic beverages -Take any medication unless instructed by your physician -Make any legal decisions or sign important papers.  Meals: Start with liquid foods such as gelatin or soup. Progress to regular foods as tolerated. Avoid greasy, spicy, heavy foods. If nausea and/or vomiting occur, drink only clear liquids until the nausea and/or vomiting subsides. Call your physician if vomiting continues.  Special Instructions/Symptoms: Your throat may feel dry or sore from the anesthesia or the breathing tube placed in your throat during surgery. If this causes discomfort, gargle with warm salt water. The discomfort should disappear within 24 hours.  If you had a scopolamine patch placed behind your ear for the management of post- operative nausea and/or vomiting:  1. The medication in the patch is effective for 72 hours, after which it should be removed.  Wrap patch in a tissue and discard in the trash. Wash hands thoroughly with soap and water. 2. You may remove the patch earlier than 72 hours if you experience unpleasant side effects which may include dry mouth, dizziness or visual disturbances. 3. Avoid touching the patch. Wash your hands with soap and water after contact with the patch.    DISCHARGE INSTRUCTIONS: Laparoscopy  The following instructions have been prepared to help you care for yourself upon your return home today.  Wound care:  Do not get the incision wet for the first 24 hours. The incision should be kept clean and dry.  The Band-Aids or dressings may be removed the day  after surgery.  Should the incision become sore, red, and swollen after the first week, check with your doctor.  Personal hygiene:  Shower the day after your procedure.  Activity and limitations:  Do NOT drive or operate any equipment today.  Do NOT lift anything more than 15 pounds for 2-3 weeks after surgery.  Do NOT rest in bed all day.  Walking is encouraged. Walk each day, starting slowly with 5-minute walks 3 or 4 times a day. Slowly increase the length of your walks.  Walk up and down stairs slowly.  Do NOT do strenuous activities, such as golfing, playing tennis, bowling, running, biking, weight lifting, gardening, mowing, or vacuuming for 2-4 weeks. Ask your doctor when it is okay to start.  Diet: Eat a light meal as desired this evening. You may resume your usual diet tomorrow.  Return to work: This is dependent on the type of work you do. For the most part you can return to a desk job within a week of surgery. If you are more active at work, please discuss this with your doctor.  What to expect after your surgery: You may have a slight burning sensation when you urinate on the first day. You may have a very small amount of blood in the urine. Expect to have a small amount of vaginal discharge/light bleeding for 1-2 weeks. It is not unusual to have abdominal soreness and bruising for up to 2 weeks. You may be tired and need more rest for about 1 week. You may experience shoulder  pain for 24-72 hours. Lying flat in bed may relieve it.  Call your doctor for any of the following:  Develop a fever of 100.4 or greater  Inability to urinate 6 hours after discharge from hospital  Severe pain not relieved by pain medications  Persistent of heavy bleeding at incision site  Redness or swelling around incision site after a week  Increasing nausea or vomiting    D & C Home care Instructions:   Personal hygiene:  Used sanitary napkins for vaginal drainage not tampons. Shower or tub  bathe the day after your procedure. No douching until bleeding stops. Always wipe from front to back after  Elimination.  Activity: Do not drive or operate any equipment today. The effects of the anesthesia are still present and drowsiness may result. Rest today, not necessarily flat bed rest, just take it easy. You may resume your normal activity in one to 2 days.  Sexual activity: No intercourse for one week or as indicated by your physician  Diet: Eat a light diet as desired this evening. You may resume a regular diet tomorrow.  Return to work: One to 2 days.  General Expectations of your surgery: Vaginal bleeding should be no heavier than a normal period. Spotting may continue up to 10 days. Mild cramps may continue for a couple of days. You may have a regular period in 2-6 weeks.  Unexpected observations call your doctor if these occur: persistent or heavy bleeding. Severe abdominal cramping or pain. Elevation of temperature greater than 100F.

## 2021-12-04 NOTE — Op Note (Unsigned)
Taylor Yates, [place] MEDICAL RECORD NO: [id number] ACCOUNT NO: [id number] DATE OF BIRTH: [birth year] FACILITY: [facility] LOCATION: [facility] PHYSICIAN: Coralee Edberg A. Garwin Brothers, MD  Operative Report   DATE OF PROCEDURE: 12/03/2021   PREOPERATIVE DIAGNOSES:  Pelvic pain, menorrhagia with regular cycles.  PROCEDURE: DaVinci robotic resection of pelvic endometriosis, diagnostic hysteroscopy, hysteroscopic resection of endometrial polyp, dilation and curettage.  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  ANESTHESIA:  General.  SURGEON:  Dr. Servando Salina.  ASSISTANT:  Gaylord Shih, RNFA.  DESCRIPTION OF PROCEDURE:  Under adequate general anesthesia, the patient was placed in the dorsal lithotomy position.  She was sterilely prepped and draped in the usual fashion.  Three way Foley catheter was sterilely placed.  Bivalve speculum was  placed in the vagina.  A single-tooth tenaculum was placed on the anterior lip of the cervix.  The cervix was then dilated up to #19 [facility] dilator.  A diagnostic hysteroscope was introduced into the uterine cavity.  The right tubal ostia was seen better than the left.  Anteriorly, there was thickening and suggestive of a polypoid lesion.  The endocervical canal was without any lesions.  Using the Lite resectoscope the endometrial cavity and the endometrial which appeared to be a polypoid lesion was all  resected.  When that was felt to be complete, the hysteroscope was removed.  The uterus sounded to 7 cm and a #6 mm uterine manipulator was introduced and the bivalve speculum was removed.  Attention was then turned to the abdomen where the patient had  prior umbilical scars.  Supraumbilically 2.11% Marcaine was injected at planned incision site.  A small vertical incision was then made.  Veress needle was introduced, tested with sterile water.  Three and a half liters of CO2 was  insufflated.  Opening pressure was 7. Veress  needle was then removed.  A robotic camera port was then inserted without incident.  A lighted robotic scope was then introduced into the abdominal cavity showing evidence of some distention of the peritoneum in the anterior abdominal wall, but overall  without any incident.  The patient was placed in deep Trendelenburg position.  Panoramic inspection of the pelvis was done, it was then noted that the patient had a large right posterior cul-de-sac Allen-Masters window.  Another smaller one in the left,  several endometriotic implants were noted and at that point, decision was made to proceed with robotic.  Two robotic ports was placed on either side about 10 cm apart and on the left midway between the robotic camera and that left lateral port, a 5 mm  AirSeal was placed. After injecting the skin with 0.25% Marcaine, all ports were then placed under direct visualization.  Once that was done, the robot was docked. In an arm #1 the  long bipolar grasper and in arm #3 was the monopolar scissors were placed.  I then went to the surgical console.  At the surgical console, the pelvis was further inspected.  There was a left anterior subserosal fibroid.  There was  Allen-Masters window posteriorly and burnt out  endometriotic implants on the  right lateral wall. Anteriorly there were also evidence of endometriosis.  The procedure was started by resecting visible endometriotic implants.  The Allen-Masters window site was resected as was the right lateral wall  endometriotic areas, anteriorly all the endometriotic implant sites were removed.  When all was felt to have been done, the pelvis was irrigated and  suctioned of debris.  Hydrodissection was utilized as part of the procedure in order to help facilitate  the dissection. Arista potato starch was placed overlying the posterior and anterior denuded areas from the resection of the endometriosis.  The patient had surgical  removal of her appendix.  The procedure was then terminated by removing the robotic  instruments, undocking the robot and removing the ports after deflating the abdomen and  then removing the AirSeal.  Incisions were then closed with 4-0 Vicryl subcuticular closures.  The instruments from the vagina was removed.  SPECIMEN:  Peritoneal resection of endometriosis, sent to pathology.  ESTIMATED BLOOD LOSS:  5 mL.  Urine was about 400 mL.   COMPLICATIONS:  None.  The patient tolerated the procedure well and was transferred to recovery room in stable condition.      SUJ D: 12/03/2021 11:33:40 pm T: 12/04/2021 1:54:00 am  JOB: [id number]/ [id number]

## 2021-12-06 ENCOUNTER — Encounter (HOSPITAL_BASED_OUTPATIENT_CLINIC_OR_DEPARTMENT_OTHER): Payer: Self-pay | Admitting: Obstetrics and Gynecology

## 2021-12-07 LAB — SURGICAL PATHOLOGY

## 2022-02-07 HISTORY — PX: ANKLE FRACTURE SURGERY: SHX122

## 2022-08-11 ENCOUNTER — Other Ambulatory Visit: Payer: Self-pay

## 2022-08-11 ENCOUNTER — Emergency Department (HOSPITAL_COMMUNITY)
Admission: EM | Admit: 2022-08-11 | Discharge: 2022-08-11 | Disposition: A | Discharge status: Home / Self Care | Payer: BC Managed Care – PPO | Attending: Emergency Medicine | Admitting: Emergency Medicine

## 2022-08-11 ENCOUNTER — Emergency Department (HOSPITAL_COMMUNITY): Payer: BC Managed Care – PPO

## 2022-08-11 ENCOUNTER — Encounter (HOSPITAL_COMMUNITY): Payer: Self-pay

## 2022-08-11 DIAGNOSIS — S82832A Other fracture of upper and lower end of left fibula, initial encounter for closed fracture: Secondary | ICD-10-CM | POA: Insufficient documentation

## 2022-08-11 DIAGNOSIS — W109XXA Fall (on) (from) unspecified stairs and steps, initial encounter: Secondary | ICD-10-CM | POA: Insufficient documentation

## 2022-08-11 DIAGNOSIS — S82302A Unspecified fracture of lower end of left tibia, initial encounter for closed fracture: Secondary | ICD-10-CM | POA: Diagnosis not present

## 2022-08-11 DIAGNOSIS — S8292XA Unspecified fracture of left lower leg, initial encounter for closed fracture: Secondary | ICD-10-CM | POA: Insufficient documentation

## 2022-08-11 DIAGNOSIS — S82892A Other fracture of left lower leg, initial encounter for closed fracture: Secondary | ICD-10-CM

## 2022-08-11 DIAGNOSIS — S82831A Other fracture of upper and lower end of right fibula, initial encounter for closed fracture: Secondary | ICD-10-CM

## 2022-08-11 DIAGNOSIS — Y9301 Activity, walking, marching and hiking: Secondary | ICD-10-CM | POA: Diagnosis not present

## 2022-08-11 DIAGNOSIS — S99912A Unspecified injury of left ankle, initial encounter: Secondary | ICD-10-CM | POA: Diagnosis present

## 2022-08-11 MED ORDER — PROPOFOL 10 MG/ML IV BOLUS
100.0000 mg | Freq: Once | INTRAVENOUS | Status: AC
  Administered 2022-08-11: 100 mg via INTRAVENOUS
  Filled 2022-08-11: qty 20

## 2022-08-11 MED ORDER — ONDANSETRON HCL 4 MG/2ML IJ SOLN
4.0000 mg | Freq: Once | INTRAMUSCULAR | Status: AC
  Administered 2022-08-11: 4 mg via INTRAVENOUS
  Filled 2022-08-11: qty 2

## 2022-08-11 MED ORDER — PROPOFOL 10 MG/ML IV BOLUS
100.0000 mg | Freq: Once | INTRAVENOUS | Status: DC
  Filled 2022-08-11: qty 20

## 2022-08-11 MED ORDER — HYDROMORPHONE HCL 1 MG/ML IJ SOLN
1.0000 mg | Freq: Once | INTRAMUSCULAR | Status: AC
  Administered 2022-08-11: 1 mg via INTRAVENOUS
  Filled 2022-08-11: qty 1

## 2022-08-11 MED ORDER — HYDROMORPHONE HCL 2 MG PO TABS: 2.0000 mg | ORAL_TABLET | Freq: Four times a day (QID) | ORAL | 0 refills | Status: DC | PRN

## 2022-08-11 NOTE — ED Notes (Signed)
Attempted to give crutches to pt, pt denied them stating they have crutches at home.

## 2022-08-11 NOTE — Discharge Instructions (Signed)
You were seen for your broken left ankle in the emergency department.    At home, please keep the splint on until you are able to see the orthopedic surgeons.  Use the crutches we have given you do not bear weight on your ankle until the surgeons tell you safe to do so.   Take Tylenol and ibuprofen for the pain.  You may also take the Dilaudid we have prescribed you for any breakthrough pain that may have.  Do not take this before driving or operating heavy machinery.  Do not take this medication with alcohol.    Follow-up with the orthopedic surgeons within the next week.    Return immediately to the emergency department if you experience any of the following: Severe pain, numbness or weakness of your extremity, or any other concerning symptoms.     Thank you for visiting our Emergency Department. It was a pleasure taking care of you today.

## 2022-08-11 NOTE — ED Notes (Signed)
Pt teaching provided on medications that may cause drowsiness. Pt instructed not to drive or operate heavy machinery while taking the prescribed medication. Pt verbalized understanding.   Pt provided discharge instructions and prescription information. Pt was given the opportunity to ask questions and questions were answered.   

## 2022-08-11 NOTE — ED Provider Notes (Signed)
Patient reporting her pharmacy is closed, requesting new script to [facility] [address] which is open.  I have sent 3 dilaudid tablets for overnight coverage, she will pick up the other script from her pharmacy tomorrow.  Original EDP Dr Jarold Motto having left after the end of his shift.   Terald Sleeper, MD 08/11/22 [phone]

## 2022-08-11 NOTE — ED Provider Notes (Signed)
[place] EMERGENCY DEPARTMENT AT [facility] Provider Note   CSN: [id number] Arrival date & time: 08/11/22  1310     History  Chief Complaint  Patient presents with   Fall   Ankle Injury    Taylor Yates is a 42 y.o. female.  42 year old female with a history of hypothyroidism and left knee arthroscopy who presents emergency department with left ankle injury.  Patient was walking down some stairs when she missed 2 of the steps and landed on her left foot while it was inverted.  Says that she twisted her ankle and it was deformed.  No other injuries as a result of the fall.  When EMS arrived they were able to somewhat straighten the ankle and gave her 100 mcg of fentanyl.  Says that she had arthroscopy in her left knee.  She works in Teacher, music.  Last ate at 11 AM and only had a few apple slices.  Denies any other surgeries to her extremities.  No numbness or weakness to the foot.       Home Medications Prior to Admission medications   Medication Sig Start Date End Date Taking? Authorizing Provider  HYDROmorphone (DILAUDID) 2 MG tablet Take 1 tablet (2 mg total) by mouth every 6 (six) hours as needed for severe pain. 08/11/22  Yes Rondel Baton, MD  cetirizine (ZYRTEC ALLERGY) 10 MG tablet Take 1 tablet (10 mg total) by mouth daily. 09/30/21   Wallis Bamberg, PA-C  dexlansoprazole (DEXILANT) 60 MG capsule 1 cap(s) orally once a day for 30 day(s) 08/22/16   [provider]  hyoscyamine (LEVSIN) 0.125 MG tablet Take 1 tablet (0.125 mg total) by mouth every 4 (four) hours as needed for cramping. 01/11/19   Wynn Banker, MD  ibuprofen (ADVIL) 800 MG tablet Take 1 tablet (800 mg total) by mouth every 8 (eight) hours as needed. 02/19/19   Vivi Barrack, DPM  methylphenidate (RITALIN LA) 30 MG 24 hr capsule Take 1 capsule (30 mg total) by mouth every morning. 02/26/19   [place], Betty G, MD  methylphenidate (RITALIN) 10 MG tablet 1 tab in the afternoon as  needed. 02/26/19   [place], Betty G, MD  Naproxen Sodium ([employer] NAPROXEN SODIUM) 220 MG CAPS Use one tablet day with food for 1-2 weeks Patient taking differently: as needed. Use one tablet day with food for 1-2 weeks 10/04/12   Artist Pais, Doe-Hyun R, DO  pseudoephedrine (SUDAFED) 60 MG tablet Take 1 tablet (60 mg total) by mouth every 8 (eight) hours as needed for congestion. 09/30/21   Wallis Bamberg, PA-C  thyroid [facility] THYROID) 60 MG tablet Take 1 tablet (60 mg total) by mouth daily before breakfast. 02/22/19   [place], Betty G, MD      Allergies    Egg-derived products, Oxycodone-acetaminophen, and Prednisone    Review of Systems   Review of Systems  Physical Exam Updated Vital Signs BP 124/66   Pulse 72   Temp 98.1 F (36.7 C) (Oral)   Resp 16   Ht 5\' 4"  (1.626 m)   Wt 102.1 kg   SpO2 100%   BMI 38.62 kg/m  Physical Exam Vitals and nursing note reviewed.  Constitutional:      General: She is not in acute distress.    Appearance: She is well-developed.  HENT:     Head: Normocephalic and atraumatic.     Right Ear: External ear normal.     Left Ear: External ear normal.  Nose: Nose normal.  Eyes:     Extraocular Movements: Extraocular movements intact.     Conjunctiva/sclera: Conjunctivae normal.     Pupils: Pupils are equal, round, and reactive to light.  Cardiovascular:     Rate and Rhythm: Normal rate and regular rhythm.  Pulmonary:     Effort: Pulmonary effort is normal. No respiratory distress.  Musculoskeletal:     Cervical back: Normal range of motion and neck supple.     Right lower leg: No edema.     Left lower leg: No edema.     Comments: Obvious deformity of left ankle.  Tenderness palpation of left ankle both medially and laterally.  Swelling of the left foot on the lateral aspect.  No tenderness to palpation of knee or proximal tibia and fibula.  DP pulse 2+ and left foot.  Intact sensation to light touch of the left foot.  Foot appears warm and well-perfused.  Able  to wiggle all toes of the left foot.  Skin:    General: Skin is warm and dry.  Neurological:     Mental Status: She is alert and oriented to person, place, and time. Mental status is at baseline.  Psychiatric:        Mood and Affect: Mood normal.     ED Results / Procedures / Treatments   Labs (all labs ordered are listed, but only abnormal results are displayed) Labs Reviewed - No data to display  EKG None  Radiology CT Ankle Left Wo Contrast  Result Date: 08/11/2022 CLINICAL DATA:  Twisting injury with ankle fracture. Patient presents for further characterization. EXAM: CT OF THE LEFT ANKLE WITHOUT CONTRAST TECHNIQUE: Multidetector CT imaging of the left ankle was performed according to the standard protocol. Multiplanar CT image reconstructions were also generated. RADIATION DOSE REDUCTION: This exam was performed according to the departmental dose-optimization program which includes automated exposure control, adjustment of the mA and/or kV according to patient size and/or use of iterative reconstruction technique. COMPARISON:  08/11/2022 FINDINGS: Bones/Joint/Cartilage Comminuted fracture of the distal fibular metaphysis in near anatomic alignment. Comminuted fracture of the medial malleolus with 4 mm of distraction, in near anatomic alignment. Intra-articular fracture of the posterior distal tibial malleolus without significant displacement. No other acute fracture or dislocation. Small plantar calcaneal spur. Mild enthesopathic changes of the Achilles tendon insertion. Normal alignment. No joint effusion. Ligaments Ligaments are suboptimally evaluated by CT. Muscles and Tendons Muscles are normal. No muscle atrophy. No intramuscular fluid collection or hematoma. Flexor, extensor, peroneal and Achilles tendons are intact. Soft tissue No fluid collection. No soft tissue mass. Hemorrhagic fluid in the subcutaneous fat overlying the lateral malleolus and to lesser extent medial malleolus.  IMPRESSION: 1. Acute trimalleolar left ankle fracture as described above. Electronically Signed   By: Elige Ko M.D.   On: 08/11/2022 16:48   DG Ankle Complete Left  Result Date: 08/11/2022 CLINICAL DATA:  Status post ankle fracture reduction. EXAM: LEFT ANKLE COMPLETE - 3+ VIEW COMPARISON:  08/11/2022 FINDINGS: Transverse fracture of the distal fibular metaphysis with 3 mm of lateral displacement in significantly improved alignment compared with the prior exam. Oblique fracture of the medial malleolus with improved alignment compared with the prior exam. Ankle mortise is intact. No aggressive osseous lesion. Normal alignment. Soft tissue swelling around the ankle. No radiopaque foreign body or soft tissue emphysema. IMPRESSION: 1. Transverse fracture of the distal fibular metaphysis with 3 mm of lateral displacement in significantly improved alignment compared with the prior exam. 2.  Oblique fracture of the medial malleolus with improved alignment compared with the prior exam. Electronically Signed   By: Elige Ko M.D.   On: 08/11/2022 15:35   DG Ankle Complete Left  Result Date: 08/11/2022 CLINICAL DATA:  Left ankle deformity EXAM: LEFT ANKLE COMPLETE - 3 VIEW; LEFT FOOT - COMPLETE 3 VIEW; LEFT TIBIA AND FIBULA - 2 VIEW COMPARISON:  Left foot radiograph January 24, 2019, left ankle radiograph July 13, 2007 FINDINGS: Complex comminuted and displaced fractures of the distal tibia and fibula. In the fibula, there is an oblique/comminuted fracture of the distal shaft with posterior displacement of the distal fracture fragment and approximately 4.0 cm of foreshortening. In the tibia, there is an avulsion fracture of the medial malleolus, which is anteriorly displaced. There is also tibiotalar joint dislocation, with anterior displacement of the tibia. There is widening of the ankle mortise. No additional acute fracture or dislocation is visualized. Of note, there is limited assessment of Lisfranc stability  on the provided views, due to limitations in patient positioning. Diffuse soft tissue swelling overlying the ankle. IMPRESSION: Complex comminuted and displaced fractures of the distal tibia and fibula, with associated tibiotalar dislocation and widening of the ankle mortise. Electronically Signed   By: Jacob Moores M.D.   On: 08/11/2022 14:17   DG Foot Complete Left  Result Date: 08/11/2022 CLINICAL DATA:  Left ankle deformity EXAM: LEFT ANKLE COMPLETE - 3 VIEW; LEFT FOOT - COMPLETE 3 VIEW; LEFT TIBIA AND FIBULA - 2 VIEW COMPARISON:  Left foot radiograph January 24, 2019, left ankle radiograph July 13, 2007 FINDINGS: Complex comminuted and displaced fractures of the distal tibia and fibula. In the fibula, there is an oblique/comminuted fracture of the distal shaft with posterior displacement of the distal fracture fragment and approximately 4.0 cm of foreshortening. In the tibia, there is an avulsion fracture of the medial malleolus, which is anteriorly displaced. There is also tibiotalar joint dislocation, with anterior displacement of the tibia. There is widening of the ankle mortise. No additional acute fracture or dislocation is visualized. Of note, there is limited assessment of Lisfranc stability on the provided views, due to limitations in patient positioning. Diffuse soft tissue swelling overlying the ankle. IMPRESSION: Complex comminuted and displaced fractures of the distal tibia and fibula, with associated tibiotalar dislocation and widening of the ankle mortise. Electronically Signed   By: Jacob Moores M.D.   On: 08/11/2022 14:17   DG Tibia/Fibula Left  Result Date: 08/11/2022 CLINICAL DATA:  Left ankle deformity EXAM: LEFT ANKLE COMPLETE - 3 VIEW; LEFT FOOT - COMPLETE 3 VIEW; LEFT TIBIA AND FIBULA - 2 VIEW COMPARISON:  Left foot radiograph January 24, 2019, left ankle radiograph July 13, 2007 FINDINGS: Complex comminuted and displaced fractures of the distal tibia and fibula. In the  fibula, there is an oblique/comminuted fracture of the distal shaft with posterior displacement of the distal fracture fragment and approximately 4.0 cm of foreshortening. In the tibia, there is an avulsion fracture of the medial malleolus, which is anteriorly displaced. There is also tibiotalar joint dislocation, with anterior displacement of the tibia. There is widening of the ankle mortise. No additional acute fracture or dislocation is visualized. Of note, there is limited assessment of Lisfranc stability on the provided views, due to limitations in patient positioning. Diffuse soft tissue swelling overlying the ankle. IMPRESSION: Complex comminuted and displaced fractures of the distal tibia and fibula, with associated tibiotalar dislocation and widening of the ankle mortise. Electronically Signed   By: Livia Snellen  Konanur M.D.   On: 08/11/2022 14:17    Procedures .Sedation  Date/Time: 08/11/2022 4:55 PM  Performed by: Rondel Baton, MD Authorized by: Rondel Baton, MD   Consent:    Consent obtained:  Written   Consent given by:  Patient Universal protocol:    Procedure explained and questions answered to patient or proxy's satisfaction: yes     Imaging studies available: yes     Immediately prior to procedure, a time out was called: yes     Patient identity confirmed:  Arm band and verbally with patient Indications:    Procedure performed:  Fracture reduction   Procedure necessitating sedation performed by:  Physician performing sedation Pre-sedation assessment:    Time since last food or drink:  4   ASA classification: class 1 - normal, healthy patient     Mouth opening:  3 or more finger widths   Thyromental distance:  4 finger widths   Mallampati score:  III - soft palate, base of uvula visible   Neck mobility: normal     Pre-sedation assessments completed and reviewed: pre-procedure airway patency not reviewed and pre-procedure mental status not reviewed   Immediate  pre-procedure details:    Reassessment: Patient reassessed immediately prior to procedure   Procedure details (see MAR for exact dosages):    Preoxygenation:  Nasal cannula   Sedation:  Propofol   Intended level of sedation: deep   Intra-procedure monitoring:  Blood pressure monitoring, continuous capnometry, frequent LOC assessments and continuous pulse oximetry   Intra-procedure events: hypoxia     Intra-procedure management:  Airway repositioning and BVM ventilation   Total Provider sedation time (minutes):  15 Post-procedure details:    Attendance: Constant attendance by certified staff until patient recovered     Recovery: Patient returned to pre-procedure baseline     Post-sedation assessments completed and reviewed: post-procedure airway patency not reviewed and post-procedure mental status not reviewed     Procedure completion:  Tolerated with difficulty Comments:     Did have 2 episodes of apnea and hypoxia that required brief bag valve masking for several breaths. Patient recovered uneventfully otherwise.  Reduction of fracture  Date/Time: 08/11/2022 4:58 PM  Performed by: Rondel Baton, MD Authorized by: Rondel Baton, MD  Consent: Verbal consent obtained. Consent given by: patient Patient understanding: patient states understanding of the procedure being performed Patient consent: the patient's understanding of the procedure matches consent given Imaging studies: imaging studies available Patient identity confirmed: verbally with patient Time out: Immediately prior to procedure a "time out" was called to verify the correct patient, procedure, equipment, support staff and site/side marked as required. Local anesthesia used: no  Anesthesia: Local anesthesia used: no  Sedation: Patient sedated: yes Sedation type: moderate (conscious) sedation Sedatives: propofol  Patient tolerance: patient tolerated the procedure well with no immediate complications Comments:  Successful reduction of trimalleolar fracture of the left ankle.  Placed in posterior short leg splint with stirrup.   Gaylord Shih Injury Treatment  Date/Time: 08/11/2022 4:59 PM  Performed by: Rondel Baton, MD Authorized by: Rondel Baton, MD   Consent:    Consent obtained:  Verbal   Consent given by:  Patient   Risks discussed:  FractureInjury location: ankle Location details: left ankle Injury type: fracture Fracture type: trimalleolar Pre-procedure neurovascular assessment: neurovascularly intact Manipulation performed: yes Reduction successful: yes Immobilization: splint Splint Applied by: ED Provider Supplies used: Ortho-Glass Post-procedure neurovascular assessment: post-procedure neurovascularly intact  Medications Ordered in ED Medications  propofol (DIPRIVAN) 10 mg/mL bolus/IV push 100 mg (100 mg Intravenous Not Given 08/11/22 1612)  HYDROmorphone (DILAUDID) injection 1 mg (1 mg Intravenous Given 08/11/22 1406)  ondansetron (ZOFRAN) injection 4 mg (4 mg Intravenous Given 08/11/22 1407)  propofol (DIPRIVAN) 10 mg/mL bolus/IV push 100 mg (100 mg Intravenous Given 08/11/22 1458)  propofol (DIPRIVAN) 10 mg/mL bolus/IV push 100 mg (100 mg Intravenous Given 08/11/22 1458)    ED Course/ Medical Decision Making/ A&P Clinical Course as of 08/11/22 1701  Thu Aug 11, 2022  [address] Filter recommends CT scan and follow-up with Dr. Susa Simmonds in clinic and for the patient to be nonweightbearing.  No immediate operation required. [RP]    Clinical Course User Index [RP] Rondel Baton, MD                             Medical Decision Making Amount and/or Complexity of Data Reviewed Radiology: ordered.  Risk Prescription drug management.   Taylor Yates is a 42 y.o. female with comorbidities that complicate the patient evaluation including hypothyroidism and left knee arthroscopy who presents emergency department with left ankle injury.    Initial Ddx:  Ankle  fracture, dislocation, neurovascular injury  MDM/Course:  Patient appears to have obvious ankle deformity exam that was suspicious for fracture dislocation.  Was initially neurovascular intact.  Was given Dilaudid for pain control.  X-rays did show that she had a trimalleolar fracture.  Patient underwent a sedation and reduction of her ankle which was successful.  Discussed with orthopedics who wants her to be nonweightbearing and to follow-up in a week.  Patient was offered crutches but states that she already has them at home.  Has an allergy to oxycodone so was given a prescription of Dilaudid which she has tolerated before.  Upon re-evaluation pain is improved.  Will have her follow-up with orthopedics as an outpatient.  This patient presents to the ED for concern of complaints listed in HPI, this involves an extensive number of treatment options, and is a complaint that carries with it a high risk of complications and morbidity. Disposition including potential need for admission considered.   Dispo: DC Home. Return precautions discussed including, but not limited to, those listed in the AVS. Allowed pt time to ask questions which were answered fully prior to dc.  Additional history obtained from family Records reviewed Outpatient Clinic Notes I independently reviewed the following imaging with scope of interpretation limited to determining acute life threatening conditions related to emergency care: Extremity x-ray(s) and agree with the radiologist interpretation with the following exceptions: none I personally reviewed and interpreted cardiac monitoring: normal sinus rhythm  I personally reviewed and interpreted the pt's EKG: see above for interpretation  I have reviewed the patients home medications and made adjustments as needed Consults: Orthopedics        Final Clinical Impression(s) / ED Diagnoses Final diagnoses:  Closed fracture of left ankle, initial encounter  Closed  fracture of distal end of right fibula, unspecified fracture morphology, initial encounter  Closed fracture of distal end of left tibia, unspecified fracture morphology, initial encounter    Rx / DC Orders ED Discharge Orders          Ordered    HYDROmorphone (DILAUDID) 2 MG tablet  Every 6 hours PRN        08/11/22 1630  Rondel Baton, MD 08/11/22 [phone]

## 2022-08-11 NOTE — ED Triage Notes (Signed)
Patient BIB EMS for fall. Patient missed last two steps going down and twisted her ankle. Patient has obvious deformity of the left ankle. No numbness or tingling. Pulses palpable, able to wiggle toes. Patient denies any other pain.

## 2023-01-17 ENCOUNTER — Encounter: Payer: Self-pay | Admitting: Neurology
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
## 2023-01-26 ENCOUNTER — Encounter: Payer: Self-pay | Admitting: Neurology

## 2023-01-26 ENCOUNTER — Ambulatory Visit (INDEPENDENT_AMBULATORY_CARE_PROVIDER_SITE_OTHER): Payer: BC Managed Care – PPO | Admitting: Neurology

## 2023-01-26 VITALS — BP 105/70 | HR 96 | Ht 64.0 in | Wt 218.0 lb

## 2023-01-26 DIAGNOSIS — E611 Iron deficiency: Secondary | ICD-10-CM

## 2023-01-26 DIAGNOSIS — H9313 Tinnitus, bilateral: Secondary | ICD-10-CM

## 2023-01-26 DIAGNOSIS — R930 Abnormal findings on diagnostic imaging of skull and head, not elsewhere classified: Secondary | ICD-10-CM

## 2023-01-26 DIAGNOSIS — G43009 Migraine without aura, not intractable, without status migrainosus: Secondary | ICD-10-CM

## 2023-01-26 DIAGNOSIS — H5319 Other subjective visual disturbances: Secondary | ICD-10-CM

## 2023-01-26 DIAGNOSIS — M542 Cervicalgia: Secondary | ICD-10-CM

## 2023-01-26 MED ORDER — AMITRIPTYLINE HCL 10 MG PO TABS: 20.0000 mg | ORAL_TABLET | Freq: Every day | ORAL | 3 refills | Status: DC

## 2023-01-26 NOTE — Patient Instructions (Addendum)
I saw you today for visual snow syndrome, tinnitus, and headaches. I think these headaches could be migraine, which is also associated with visual snow syndrome.  I would like to investigate your symptoms further: -There are lab tests I want to get, but you will get me your recent labs to make sure I don't repeat anything that has already been done. -MRI brain -Sleep study  I will be in touch when I have those results.  For your symptoms, amitriptyline has been shown to treat visual snow syndrome and migraines, so I think this is a reasonable medication to try. You will take 1 tablet (10 mg) at bedtime for 1 week, then increase to 2 tablets (20 mg) at bedtime thereafter. This medication can cause drowsiness and dry mouth most commonly.  Your ferritin was low, which is suggestive of iron deficiency. You mention you have been told to take iron supplementation. I agree with this. You should take: -Start ferrous sulfate 325 mg daily.  Discussed not to take with milk.  Discussed that this can cause constipation and to drink plenty of water.   -Take vitamin C, 100-200 mg with each ferrous sulfate dose  I will see you back in clinic in about 4 months. Please let me know if you have any questions or concerns in the meantime.   The physicians and staff at [facility] are committed to providing excellent care. You may receive a survey requesting feedback about your experience at our office. We strive to receive "very good" responses to the survey questions. If you feel that your experience would prevent you from giving the office a "very good " response, please contact our office to try to remedy the situation. We may be reached at [phone]. Thank you for taking the time out of your busy day to complete the survey.  Jacquelyne Balint, MD [facility]  More migraine information: Be aware of common food triggers:  - Caffeine:  coffee, black tea, cola, Mt. Dew  - Chocolate  - Dairy:  aged  cheeses (brie, blue, cheddar, gouda, [place], provolone, [place], Swiss, etc), chocolate milk, buttermilk, sour cream, limit eggs and yogurt  - Nuts, peanut butter  - Alcohol  - Cereals/grains:  FRESH breads (fresh bagels, sourdough, doughnuts), yeast productions  - Processed/canned/aged/cured meats (pre-packaged deli meats, hotdogs)  - MSG/glutamate:  soy sauce, flavor enhancer, pickled/preserved/marinated foods  - Sweeteners:  aspartame (Equal, Nutrasweet).  Sugar and Splenda are okay  - Vegetables:  legumes (lima beans, lentils, snow peas, fava beans, pinto peans, peas, garbanzo beans), sauerkraut, onions, olives, pickles  - Fruit:  avocados, bananas, citrus fruit (orange, lemon, grapefruit), mango  - Other:  Frozen meals, macaroni and cheese Routine exercise Stay adequately hydrated (aim for 64 oz water daily) Keep headache diary Maintain proper stress management Maintain proper sleep hygiene Do not skip meals Consider supplements:  magnesium citrate 400mg  daily, riboflavin 400mg  daily, coenzyme Q10 100mg  three times daily.

## 2023-01-27 ENCOUNTER — Other Ambulatory Visit: Payer: Self-pay

## 2023-01-27 ENCOUNTER — Encounter: Payer: Self-pay | Admitting: Neurology

## 2023-01-27 DIAGNOSIS — H5319 Other subjective visual disturbances: Secondary | ICD-10-CM

## 2023-01-27 DIAGNOSIS — R930 Abnormal findings on diagnostic imaging of skull and head, not elsewhere classified: Secondary | ICD-10-CM

## 2023-01-27 DIAGNOSIS — G43009 Migraine without aura, not intractable, without status migrainosus: Secondary | ICD-10-CM

## 2023-01-27 DIAGNOSIS — H9313 Tinnitus, bilateral: Secondary | ICD-10-CM

## 2023-01-27 DIAGNOSIS — M542 Cervicalgia: Secondary | ICD-10-CM

## 2023-01-27 DIAGNOSIS — E611 Iron deficiency: Secondary | ICD-10-CM

## 2023-01-30 ENCOUNTER — Other Ambulatory Visit: Payer: Self-pay

## 2023-01-30 DIAGNOSIS — H9313 Tinnitus, bilateral: Secondary | ICD-10-CM

## 2023-01-30 DIAGNOSIS — M542 Cervicalgia: Secondary | ICD-10-CM

## 2023-01-30 DIAGNOSIS — G43009 Migraine without aura, not intractable, without status migrainosus: Secondary | ICD-10-CM

## 2023-01-30 DIAGNOSIS — H5319 Other subjective visual disturbances: Secondary | ICD-10-CM

## 2023-01-30 DIAGNOSIS — R930 Abnormal findings on diagnostic imaging of skull and head, not elsewhere classified: Secondary | ICD-10-CM

## 2023-01-30 DIAGNOSIS — E611 Iron deficiency: Secondary | ICD-10-CM

## 2023-02-03 ENCOUNTER — Other Ambulatory Visit: Payer: Self-pay

## 2023-02-03 ENCOUNTER — Telehealth: Payer: Self-pay

## 2023-02-03 DIAGNOSIS — M542 Cervicalgia: Secondary | ICD-10-CM

## 2023-02-03 DIAGNOSIS — H5319 Other subjective visual disturbances: Secondary | ICD-10-CM

## 2023-02-03 DIAGNOSIS — R930 Abnormal findings on diagnostic imaging of skull and head, not elsewhere classified: Secondary | ICD-10-CM

## 2023-02-03 DIAGNOSIS — G43009 Migraine without aura, not intractable, without status migrainosus: Secondary | ICD-10-CM

## 2023-02-03 DIAGNOSIS — H9313 Tinnitus, bilateral: Secondary | ICD-10-CM

## 2023-02-03 DIAGNOSIS — E611 Iron deficiency: Secondary | ICD-10-CM

## 2023-02-03 NOTE — Telephone Encounter (Signed)
Called and reported labs per Dr. Loleta Chance to pt. She understood and She reported she is already taking Vitamin D once a week. ( 50000 units).

## 2023-02-17 ENCOUNTER — Other Ambulatory Visit: Payer: BC Managed Care – PPO

## 2023-02-21 ENCOUNTER — Ambulatory Visit
Admission: RE | Admit: 2023-02-21 | Discharge: 2023-02-21 | Disposition: A | Discharge status: Home / Self Care | Payer: BC Managed Care – PPO | Source: Ambulatory Visit | Attending: Neurology

## 2023-02-21 DIAGNOSIS — H5319 Other subjective visual disturbances: Secondary | ICD-10-CM

## 2023-02-21 DIAGNOSIS — E611 Iron deficiency: Secondary | ICD-10-CM

## 2023-02-21 DIAGNOSIS — G43009 Migraine without aura, not intractable, without status migrainosus: Secondary | ICD-10-CM

## 2023-02-21 DIAGNOSIS — R930 Abnormal findings on diagnostic imaging of skull and head, not elsewhere classified: Secondary | ICD-10-CM

## 2023-02-21 DIAGNOSIS — H9313 Tinnitus, bilateral: Secondary | ICD-10-CM

## 2023-02-21 DIAGNOSIS — M542 Cervicalgia: Secondary | ICD-10-CM

## 2023-02-21 MED ORDER — GADOPICLENOL 0.5 MMOL/ML IV SOLN
10.0000 mL | Freq: Once | INTRAVENOUS | Status: AC | PRN
  Administered 2023-02-21: 10 mL via INTRAVENOUS

## 2023-02-22 ENCOUNTER — Encounter: Payer: Self-pay | Admitting: Neurology

## 2023-02-28 ENCOUNTER — Other Ambulatory Visit: Payer: Self-pay | Admitting: Neurology

## 2023-02-28 DIAGNOSIS — G43009 Migraine without aura, not intractable, without status migrainosus: Secondary | ICD-10-CM

## 2023-02-28 DIAGNOSIS — H5319 Other subjective visual disturbances: Secondary | ICD-10-CM

## 2023-03-01 ENCOUNTER — Other Ambulatory Visit: Payer: Self-pay

## 2023-03-01 DIAGNOSIS — H5319 Other subjective visual disturbances: Secondary | ICD-10-CM

## 2023-03-01 DIAGNOSIS — G43009 Migraine without aura, not intractable, without status migrainosus: Secondary | ICD-10-CM

## 2023-03-01 MED ORDER — AMITRIPTYLINE HCL 10 MG PO TABS: ORAL_TABLET | ORAL | 0 refills | Status: DC

## 2023-04-18 ENCOUNTER — Encounter (HOSPITAL_BASED_OUTPATIENT_CLINIC_OR_DEPARTMENT_OTHER): Payer: BC Managed Care – PPO | Admitting: Internal Medicine

## 2023-05-03 ENCOUNTER — Encounter: Payer: Self-pay | Admitting: Neurology

## 2023-06-01 ENCOUNTER — Ambulatory Visit: Admitting: Neurology

## 2023-06-02 ENCOUNTER — Encounter (HOSPITAL_BASED_OUTPATIENT_CLINIC_OR_DEPARTMENT_OTHER): Payer: Self-pay

## 2023-06-02 ENCOUNTER — Ambulatory Visit (HOSPITAL_BASED_OUTPATIENT_CLINIC_OR_DEPARTMENT_OTHER): Payer: BC Managed Care – PPO | Attending: Neurology | Admitting: Internal Medicine

## 2023-06-02 VITALS — Ht 64.0 in | Wt 210.0 lb

## 2023-06-02 DIAGNOSIS — E611 Iron deficiency: Secondary | ICD-10-CM | POA: Insufficient documentation

## 2023-06-02 DIAGNOSIS — M542 Cervicalgia: Secondary | ICD-10-CM | POA: Insufficient documentation

## 2023-06-02 DIAGNOSIS — R930 Abnormal findings on diagnostic imaging of skull and head, not elsewhere classified: Secondary | ICD-10-CM | POA: Insufficient documentation

## 2023-06-02 DIAGNOSIS — G479 Sleep disorder, unspecified: Secondary | ICD-10-CM | POA: Diagnosis not present

## 2023-06-02 DIAGNOSIS — H9313 Tinnitus, bilateral: Secondary | ICD-10-CM | POA: Insufficient documentation

## 2023-06-02 DIAGNOSIS — G43009 Migraine without aura, not intractable, without status migrainosus: Secondary | ICD-10-CM | POA: Diagnosis not present

## 2023-06-02 DIAGNOSIS — H5319 Other subjective visual disturbances: Secondary | ICD-10-CM | POA: Insufficient documentation

## 2023-06-08 ENCOUNTER — Ambulatory Visit: Payer: BC Managed Care – PPO | Admitting: Neurology

## 2023-06-10 DIAGNOSIS — H5319 Other subjective visual disturbances: Secondary | ICD-10-CM

## 2023-06-10 NOTE — Procedures (Signed)
 [facility] [address] [place], [place] [address] Tel: [phone]   Fax: [phone]  Polysomnography Interpretation  Patient Name:  Taylor, Yates Date:  06/02/2023 Referring Physician:  Rommie Coats, MD  Indications for Polysomnography The patient is a 43 year-old Female who is 5\' 4"  and weighs 210.0 lbs. Her BMI equals 36.3.  A full night polysomnogram was performed to evaluate for -OSA.  Medication  None reported   Polysomnogram Data A full night polysomnogram recorded the standard physiologic parameters including EEG, EOG, EMG, EKG, nasal and oral airflow.  Respiratory parameters of chest and abdominal movements were recorded with Respiratory Inductance Plethysmography belts.  Oxygen saturation was recorded by pulse oximetry.   Sleep Architecture The total recording time of the polysomnogram was 369.8 minutes.  The total sleep time was 255.0 minutes.  The patient spent 5.1% of total sleep time in Stage N1, 79.4% in Stage N2, 0.0% in Stages N3, and 15.5% in REM.  Sleep latency was 75.9 minutes.  REM latency was 77.5 minutes.  Sleep Efficiency was 69.0%.  Wake after Sleep Onset time was 22.5 minutes.  Respiratory Events The polysomnogram revealed a presence of - obstructive, - central, and - mixed apneas resulting in an Apnea index of - events per hour.  There were 3 hypopneas (>=3% desaturation and/or arousal) resulting in an Apnea\Hypopnea Index (AHI >=3% desaturation and/or arousal) of 0.7 events per hour.  There were - hypopneas (>=4% desaturation) resulting in an Apnea\Hypopnea Index (AHI >=4% desaturation) of - events per hour.  There were 2 Respiratory Effort Related Arousals resulting in a RERA index of 0.5 events per hour. The Respiratory Disturbance Index is 1.2 events per hour.  The snore index was - events per hour.  Mean oxygen saturation was 95.9%.  The lowest oxygen saturation during sleep was 92.0%.  Time spent  <=88% oxygen saturation was - minutes (-).  End Tidal CO2 during sleep ranged from - to - mmHg. End Tidal CO2 was greater than 50 mmHg for - minutes and greater than 55 mmHg for - minutes.  Limb Activity There were 18 total limb movements recorded, of this total, 11 were classified as PLMs.  PLM index was 2.6 per hour and PLM associated with Arousals index was - per hour.  Cardiac Summary The average pulse rate was 63.4 bpm.  The minimum pulse rate was 49.0 bpm while the maximum pulse rate was 94.0 bpm.  Cardiac rhythm was normal with occasional PAC.  Comment: No significant respiratory events, within normal limits, AHI (4%) 0/hr. Moderate intermittent snoring with oxygen desaturation to a nadir of 92% and mean 95.9%. EEG noted alpha intrusion throughout the study.  Diagnosis: Other sleep disorder  Recommendations: Per Neurology   This study was personally reviewed and electronically signed by: [employer], MD Accredited Board Certified in Sleep Medicine Date/Time: 06/10/23   11:44        Diagnostic PSG Report  Patient Name: Taylor Yates, Taylor Yates Study Date: 06/02/2023  Date of Birth: November 25, 1980 Study Type: Diagnostic  Age: 2 year MRN #: [id number]  Sex: Female Interpreting Physician: [employer] [id number]  Height: 5\' 4"  Referring Physician: Rommie Coats, MD  Weight: 210.0 lbs Recording Tech: Odella Bending RPSGT RST  BMI: 36.3 Scoring Tech: Odella Bending RPSGT RST  ESS: 17 Neck Size: 13   Study Overview  Lights Off: 10:51:20 PM  Count Index  Lights On: 05:01:06 AM Awakenings: 17 4.0  Time in Bed: 369.8 min. Arousals:  30 7.1  Total Sleep Time: 255.0 min. AHI (>=3% Desat and/or Ar.): 3 0.7   Sleep Efficiency: 69.0% AHI (>=4% Desat): - -   Sleep Latency: 75.9 min. Limb Movements: 18 4.2  Wake After Sleep Onset: 22.5 min. Snore: - -  REM Latency from Sleep Onset: 77.5 min. Desaturations: 11 2.6     Minimum SpO2 TST: 92.0%    Sleep Architecture  % of Time in  Bed Stages Time (mins) % Sleep Time  Wake 98.5   Stage N1 13.0 5.1%  Stage N2 202.5 79.4%  Stage N3 0.0 0.0%  REM 39.5 15.5%   Arousal Summary   NREM REM Sleep Index  Respiratory Arousals 1 - 1 0.2  PLM Arousals - - - -  Isolated Limb Movement Arousals 1 1 2  0.5  Snore Arousals - - - -  Spontaneous Arousals 25 2 27  6.4  Total 27 3 30  7.1   Limb Movement Summary   Count Index  Isolated Limb Movements 7 1.6  Periodic Limb Movements (PLMs) 11 2.6  Total Limb Movements 18 4.2    Respiratory Summary   By Sleep Stage By Body Position Total   NREM REM Supine Non-Supine   Time (min) 215.5 39.5 107.5 147.5 255.0         Obstructive Apnea - - - - -  Mixed Apnea - - - - -  Central Apnea - - - - -  Total Apneas - - - - -  Total Apnea Index - - - - -         Hypopneas (>=3% Desat and/or Ar.) 2 1 3  - 3  AHI (>=3% Desat and/or Ar.) 0.6 1.5 1.7 - 0.7         Hypopneas (>=4% Desat) - - - - -  AHI (>=4% Desat) - - - - -          RERAs 2 - 2 - 2  RERA Index 0.6 - 1.1 - 0.5         RDI 1.1 1.5 2.8 - 1.2    Respiratory Event Type Index  Central Apneas -  Obstructive Apneas -  Mixed Apneas -  Central Hypopneas -  Obstructive Hypopneas 0.2  Central Apnea + Hypopnea (CAHI) -  Obstructive Apnea + Hypopnea (OAHI) 0.7   Respiratory Event Durations   Apnea Hypopnea   NREM REM NREM REM  Average (seconds) - - 27.0 21.8  Maximum (seconds) - - 28.8 21.8    Oxygen Saturation Summary   Wake NREM REM TST TIB  Average SpO2 (%) 97.5% 95.2% 95.4% 95.2% 95.9%  Minimum SpO2 (%) 94.0% 92.0% 92.0% 92.0% 92.0%  Maximum SpO2 (%) 100.0% 98.0% 98.0% 98.0% 100.0%   Oxygen Saturation Distribution  Range (%) Time in range (min) Time in range (%)  90.0 - 100.0 368.9 100.0%  80.0 - 90.0 - -  70.0 - 80.0 - -  60.0 - 70.0 - -  50.0 - 60.0 - -  0.0 - 50.0 - -  Time Spent <=88% SpO2  Range (%) Time in range (min) Time in range (%)  0.0 - 88.0 - -      Count Index  Desaturations 11  2.6    Cardiac Summary   Wake NREM REM Sleep Total  Average Pulse Rate (BPM) 68.2 61.2 67.2 62.1 63.4  Minimum Pulse Rate (BPM) 50.0 50.0 54.0 50.0 49.0  Maximum Pulse Rate (BPM) 94.0 85.0 81.0 85.0 94.0   Pulse Rate Distribution:  Range (bpm)  Time in range (min) Time in range (%)  0.0 - 40.0 - -  40.0 - 60.0 140.3 37.9%  60.0 - 80.0 221.3 59.9%  80.0 - 100.0 8.1 2.2%  100.0 - 120.0 - -  120.0 - 140.0 - -  140.0 - 200.0 - -   EtCO2 Summary  Stage Min (mmHg) Average (mmHg) Max (mmHg)  Wake - - -  NREM(1+2+3) - - -  REM - - -   EtCO2 Distribution:  Range (mmHg) Time in range (min) Time in range (%)  20.0 - 40.0 - -  40.0 - 50.0 - -  50.0 - 100.0 - -  55.0 - 100.0 - -  Excluded data <20.0 & >65.0 370.0 100.0%     Hypnograms                         Technologist Comments  THE 31-YEAR-OLD FEMALE PATIENT PRESENTED TO THE SLEEP DISORDER CENTER FOR A NPSG DIAGNOSTIC STUDY WITH A CHIEF COMPLAINT OF VISUAL SNOW SYNDROME. THE PATIENT USED HER PILLOW SYSTEM DUE TO ACID REFLUX. NO BEDTIME MEDICATION WAS SELF ADMINISTERED. THE LEAD PLACEMENT WAS INITIATED, THEN THE STUDY WAS BEGUN. SUPPLEMENTAL OXYGEN WAS NOT WARRANTED DURING THE STUDY. MODERATE INTERMITTENT SNORING WAS NOTED THROUGHOUT THE STUDY. PLMs - PLMAs WERE NOTED. RESPIRATORY EVENTS WERE NOTED. QUESTIONABLE CARDIAC ARRHYTHMIAS WERE OBSERVED IN EPOCHS 390 398, 401, AND THROUGHOUT THE STUDY NO RESTROOM VISITS WERE MADE. NO OBVIOUS PARASOMNIAS WERE OBSERVED. THE PATIENT TOLERATED THE NPSG DIAGNOSTIC STUDY VERY WELL. SPONTANEOUS AROUSALS WERE NOTED. ALPHA INTRUSION WAS NOTED DURING THE ENTIRE STUDY. -Tech                          [employer], Biomedical engineer of Sleep Medicine  ELECTRONICALLY SIGNED ON:  06/10/2023, 11:35 AM [facility] PH: [phone]   FX: [phone] ACCREDITED BY THE AMERICAN ACADEMY OF SLEEP MEDICINE

## 2023-06-12 ENCOUNTER — Encounter: Payer: Self-pay | Admitting: Neurology

## 2023-06-19 ENCOUNTER — Telehealth: Payer: Self-pay | Admitting: Neurology

## 2023-06-19 NOTE — Telephone Encounter (Signed)
 Pt called to get an appt scheduled with Dr.Hill. I scheduled her for his first available date which is 11/08/23. I wasn't sure if he wanted to see her before that 10/1 date. She is on wait list. She wants to go over the medication and sleep study

## 2023-06-20 NOTE — Telephone Encounter (Signed)
 Spoke with pt and she report that it is fine.

## 2023-06-28 ENCOUNTER — Ambulatory Visit: Admitting: Neurology

## 2023-10-16 ENCOUNTER — Ambulatory Visit (HOSPITAL_COMMUNITY)
Admission: RE | Admit: 2023-10-16 | Discharge: 2023-10-16 | Disposition: A | Discharge status: Home / Self Care | Payer: Worker's Compensation | Source: Ambulatory Visit

## 2023-10-16 ENCOUNTER — Encounter (HOSPITAL_COMMUNITY): Payer: Self-pay

## 2023-10-16 VITALS — BP 127/75 | HR 64 | Temp 98.1°F | Resp 16

## 2023-10-16 DIAGNOSIS — W19XXXA Unspecified fall, initial encounter: Secondary | ICD-10-CM | POA: Diagnosis not present

## 2023-10-16 DIAGNOSIS — S00432A Contusion of left ear, initial encounter: Secondary | ICD-10-CM

## 2023-10-16 DIAGNOSIS — T07XXXA Unspecified multiple injuries, initial encounter: Secondary | ICD-10-CM | POA: Diagnosis not present

## 2023-10-16 NOTE — Discharge Instructions (Signed)
 Return if any problems.
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
## 2023-10-16 NOTE — ED Triage Notes (Signed)
 Patient here today with c/o left shoulder, left side of neck, and headache after falling today. Patient states that she was trying to get around a patient while positioning a patient for a mammogram when she tripped and fell into a cart. Patient states that she also twisted her left ankle and her left knee hit the floor. Patient states that her left ear hit the cart.
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
## 2023-11-03 ENCOUNTER — Ambulatory Visit (INDEPENDENT_AMBULATORY_CARE_PROVIDER_SITE_OTHER): Admitting: Neurology

## 2023-11-03 ENCOUNTER — Encounter: Payer: Self-pay | Admitting: Neurology

## 2023-11-03 VITALS — BP 110/59 | HR 75 | Ht 64.0 in | Wt 219.0 lb

## 2023-11-03 DIAGNOSIS — G43009 Migraine without aura, not intractable, without status migrainosus: Secondary | ICD-10-CM | POA: Diagnosis not present

## 2023-11-03 DIAGNOSIS — E538 Deficiency of other specified B group vitamins: Secondary | ICD-10-CM | POA: Diagnosis not present

## 2023-11-03 DIAGNOSIS — H5319 Other subjective visual disturbances: Secondary | ICD-10-CM | POA: Diagnosis not present

## 2023-11-03 DIAGNOSIS — Z72820 Sleep deprivation: Secondary | ICD-10-CM

## 2023-11-03 DIAGNOSIS — E559 Vitamin D deficiency, unspecified: Secondary | ICD-10-CM | POA: Diagnosis not present

## 2023-11-03 MED ORDER — AMITRIPTYLINE HCL 10 MG PO TABS: 20.0000 mg | ORAL_TABLET | Freq: Every day | ORAL | 3 refills | Status: AC

## 2023-11-03 NOTE — Patient Instructions (Signed)
-  Sleep medicine referral -B12 1000 mcg daily -Folate 1 mg daily -Vit D 1000 international units daily -Restart amitriptyline  10 mg at bedtime for 1 week, then increase 20 mg at bedtime thereafter  Return to clinic in 6 months  The physicians and staff at [facility] are committed to providing excellent care. You may receive a survey requesting feedback about your experience at our office. We strive to receive very good responses to the survey questions. If you feel that your experience would prevent you from giving the office a very good  response, please contact our office to try to remedy the situation. We may be reached at [phone]. Thank you for taking the time out of your busy day to complete the survey.  Venetia Potters, MD [facility]

## 2023-11-08 ENCOUNTER — Ambulatory Visit: Admitting: Neurology

## 2024-05-01 ENCOUNTER — Ambulatory Visit: Admitting: Neurology
